# Patient Record
Sex: Male | Born: 1935
Health system: Southern US, Community
[De-identification: ages and names within clinical notes are randomized; demographics above are authoritative.]

## PROBLEM LIST (undated history)

## (undated) DIAGNOSIS — J449 Chronic obstructive pulmonary disease, unspecified: Secondary | ICD-10-CM

## (undated) DIAGNOSIS — I2510 Atherosclerotic heart disease of native coronary artery without angina pectoris: Secondary | ICD-10-CM

## (undated) DIAGNOSIS — I340 Nonrheumatic mitral (valve) insufficiency: Secondary | ICD-10-CM

## (undated) DIAGNOSIS — I10 Essential (primary) hypertension: Secondary | ICD-10-CM

## (undated) DIAGNOSIS — I255 Ischemic cardiomyopathy: Secondary | ICD-10-CM

## (undated) DIAGNOSIS — I4891 Unspecified atrial fibrillation: Secondary | ICD-10-CM

## (undated) DIAGNOSIS — I5020 Unspecified systolic (congestive) heart failure: Secondary | ICD-10-CM

## (undated) HISTORY — DX: Chronic obstructive pulmonary disease, unspecified: J44.9

## (undated) HISTORY — DX: Unspecified systolic (congestive) heart failure: I50.20

## (undated) HISTORY — DX: Atherosclerotic heart disease of native coronary artery without angina pectoris: I25.10

## (undated) HISTORY — DX: Essential (primary) hypertension: I10

## (undated) HISTORY — DX: Nonrheumatic mitral (valve) insufficiency: I34.0

## (undated) HISTORY — DX: Ischemic cardiomyopathy: I25.5

## (undated) HISTORY — PX: CORONARY ARTERY BYPASS GRAFT: SHX141

## (undated) HISTORY — DX: Unspecified atrial fibrillation: I48.91

---

## 2015-09-24 DIAGNOSIS — I2510 Atherosclerotic heart disease of native coronary artery without angina pectoris: Secondary | ICD-10-CM | POA: Diagnosis not present

## 2015-09-24 DIAGNOSIS — I10 Essential (primary) hypertension: Secondary | ICD-10-CM | POA: Diagnosis not present

## 2015-09-24 DIAGNOSIS — E782 Mixed hyperlipidemia: Secondary | ICD-10-CM | POA: Diagnosis not present

## 2015-09-24 DIAGNOSIS — I509 Heart failure, unspecified: Secondary | ICD-10-CM | POA: Diagnosis not present

## 2015-10-01 DIAGNOSIS — Z48812 Encounter for surgical aftercare following surgery on the circulatory system: Secondary | ICD-10-CM | POA: Diagnosis not present

## 2015-10-01 DIAGNOSIS — Z955 Presence of coronary angioplasty implant and graft: Secondary | ICD-10-CM | POA: Diagnosis not present

## 2015-10-01 DIAGNOSIS — I2510 Atherosclerotic heart disease of native coronary artery without angina pectoris: Secondary | ICD-10-CM | POA: Diagnosis not present

## 2015-10-10 DIAGNOSIS — Z955 Presence of coronary angioplasty implant and graft: Secondary | ICD-10-CM | POA: Diagnosis not present

## 2015-10-10 DIAGNOSIS — Z48812 Encounter for surgical aftercare following surgery on the circulatory system: Secondary | ICD-10-CM | POA: Diagnosis not present

## 2015-10-10 DIAGNOSIS — I2510 Atherosclerotic heart disease of native coronary artery without angina pectoris: Secondary | ICD-10-CM | POA: Diagnosis not present

## 2015-10-11 DIAGNOSIS — Z48812 Encounter for surgical aftercare following surgery on the circulatory system: Secondary | ICD-10-CM | POA: Diagnosis not present

## 2015-10-11 DIAGNOSIS — I2510 Atherosclerotic heart disease of native coronary artery without angina pectoris: Secondary | ICD-10-CM | POA: Diagnosis not present

## 2015-10-11 DIAGNOSIS — Z955 Presence of coronary angioplasty implant and graft: Secondary | ICD-10-CM | POA: Diagnosis not present

## 2015-10-14 DIAGNOSIS — I2510 Atherosclerotic heart disease of native coronary artery without angina pectoris: Secondary | ICD-10-CM | POA: Diagnosis not present

## 2015-10-14 DIAGNOSIS — Z955 Presence of coronary angioplasty implant and graft: Secondary | ICD-10-CM | POA: Diagnosis not present

## 2015-10-14 DIAGNOSIS — Z48812 Encounter for surgical aftercare following surgery on the circulatory system: Secondary | ICD-10-CM | POA: Diagnosis not present

## 2015-10-16 DIAGNOSIS — Z9861 Coronary angioplasty status: Secondary | ICD-10-CM | POA: Diagnosis not present

## 2015-10-16 DIAGNOSIS — I2510 Atherosclerotic heart disease of native coronary artery without angina pectoris: Secondary | ICD-10-CM | POA: Diagnosis not present

## 2015-10-18 DIAGNOSIS — I2510 Atherosclerotic heart disease of native coronary artery without angina pectoris: Secondary | ICD-10-CM | POA: Diagnosis not present

## 2015-10-18 DIAGNOSIS — Z9861 Coronary angioplasty status: Secondary | ICD-10-CM | POA: Diagnosis not present

## 2015-10-21 DIAGNOSIS — I2510 Atherosclerotic heart disease of native coronary artery without angina pectoris: Secondary | ICD-10-CM | POA: Diagnosis not present

## 2015-10-21 DIAGNOSIS — Z9861 Coronary angioplasty status: Secondary | ICD-10-CM | POA: Diagnosis not present

## 2015-10-23 DIAGNOSIS — Z9861 Coronary angioplasty status: Secondary | ICD-10-CM | POA: Diagnosis not present

## 2015-10-23 DIAGNOSIS — I2510 Atherosclerotic heart disease of native coronary artery without angina pectoris: Secondary | ICD-10-CM | POA: Diagnosis not present

## 2015-10-25 DIAGNOSIS — I2510 Atherosclerotic heart disease of native coronary artery without angina pectoris: Secondary | ICD-10-CM | POA: Diagnosis not present

## 2015-10-25 DIAGNOSIS — Z9861 Coronary angioplasty status: Secondary | ICD-10-CM | POA: Diagnosis not present

## 2015-10-28 DIAGNOSIS — Z9861 Coronary angioplasty status: Secondary | ICD-10-CM | POA: Diagnosis not present

## 2015-10-28 DIAGNOSIS — I2510 Atherosclerotic heart disease of native coronary artery without angina pectoris: Secondary | ICD-10-CM | POA: Diagnosis not present

## 2015-10-30 DIAGNOSIS — I2510 Atherosclerotic heart disease of native coronary artery without angina pectoris: Secondary | ICD-10-CM | POA: Diagnosis not present

## 2015-10-30 DIAGNOSIS — Z9861 Coronary angioplasty status: Secondary | ICD-10-CM | POA: Diagnosis not present

## 2015-11-01 DIAGNOSIS — Z9861 Coronary angioplasty status: Secondary | ICD-10-CM | POA: Diagnosis not present

## 2015-11-01 DIAGNOSIS — I2510 Atherosclerotic heart disease of native coronary artery without angina pectoris: Secondary | ICD-10-CM | POA: Diagnosis not present

## 2015-11-04 DIAGNOSIS — Z9861 Coronary angioplasty status: Secondary | ICD-10-CM | POA: Diagnosis not present

## 2015-11-04 DIAGNOSIS — I2510 Atherosclerotic heart disease of native coronary artery without angina pectoris: Secondary | ICD-10-CM | POA: Diagnosis not present

## 2015-11-06 DIAGNOSIS — I2510 Atherosclerotic heart disease of native coronary artery without angina pectoris: Secondary | ICD-10-CM | POA: Diagnosis not present

## 2015-11-06 DIAGNOSIS — Z9861 Coronary angioplasty status: Secondary | ICD-10-CM | POA: Diagnosis not present

## 2015-11-08 DIAGNOSIS — Z9861 Coronary angioplasty status: Secondary | ICD-10-CM | POA: Diagnosis not present

## 2015-11-08 DIAGNOSIS — I2510 Atherosclerotic heart disease of native coronary artery without angina pectoris: Secondary | ICD-10-CM | POA: Diagnosis not present

## 2015-11-13 DIAGNOSIS — Z48812 Encounter for surgical aftercare following surgery on the circulatory system: Secondary | ICD-10-CM | POA: Diagnosis not present

## 2015-11-13 DIAGNOSIS — Z955 Presence of coronary angioplasty implant and graft: Secondary | ICD-10-CM | POA: Diagnosis not present

## 2015-11-13 DIAGNOSIS — I2510 Atherosclerotic heart disease of native coronary artery without angina pectoris: Secondary | ICD-10-CM | POA: Diagnosis not present

## 2015-11-15 DIAGNOSIS — Z48812 Encounter for surgical aftercare following surgery on the circulatory system: Secondary | ICD-10-CM | POA: Diagnosis not present

## 2015-11-15 DIAGNOSIS — Z955 Presence of coronary angioplasty implant and graft: Secondary | ICD-10-CM | POA: Diagnosis not present

## 2015-11-15 DIAGNOSIS — I2510 Atherosclerotic heart disease of native coronary artery without angina pectoris: Secondary | ICD-10-CM | POA: Diagnosis not present

## 2015-11-18 DIAGNOSIS — Z48812 Encounter for surgical aftercare following surgery on the circulatory system: Secondary | ICD-10-CM | POA: Diagnosis not present

## 2015-11-18 DIAGNOSIS — I2510 Atherosclerotic heart disease of native coronary artery without angina pectoris: Secondary | ICD-10-CM | POA: Diagnosis not present

## 2015-11-18 DIAGNOSIS — Z955 Presence of coronary angioplasty implant and graft: Secondary | ICD-10-CM | POA: Diagnosis not present

## 2015-11-20 DIAGNOSIS — Z955 Presence of coronary angioplasty implant and graft: Secondary | ICD-10-CM | POA: Diagnosis not present

## 2015-11-20 DIAGNOSIS — I2510 Atherosclerotic heart disease of native coronary artery without angina pectoris: Secondary | ICD-10-CM | POA: Diagnosis not present

## 2015-11-20 DIAGNOSIS — Z48812 Encounter for surgical aftercare following surgery on the circulatory system: Secondary | ICD-10-CM | POA: Diagnosis not present

## 2015-11-22 DIAGNOSIS — Z955 Presence of coronary angioplasty implant and graft: Secondary | ICD-10-CM | POA: Diagnosis not present

## 2015-11-22 DIAGNOSIS — Z48812 Encounter for surgical aftercare following surgery on the circulatory system: Secondary | ICD-10-CM | POA: Diagnosis not present

## 2015-11-22 DIAGNOSIS — I2510 Atherosclerotic heart disease of native coronary artery without angina pectoris: Secondary | ICD-10-CM | POA: Diagnosis not present

## 2015-11-25 DIAGNOSIS — Z48812 Encounter for surgical aftercare following surgery on the circulatory system: Secondary | ICD-10-CM | POA: Diagnosis not present

## 2015-11-25 DIAGNOSIS — Z955 Presence of coronary angioplasty implant and graft: Secondary | ICD-10-CM | POA: Diagnosis not present

## 2015-11-25 DIAGNOSIS — I2510 Atherosclerotic heart disease of native coronary artery without angina pectoris: Secondary | ICD-10-CM | POA: Diagnosis not present

## 2015-11-26 DIAGNOSIS — I10 Essential (primary) hypertension: Secondary | ICD-10-CM | POA: Diagnosis not present

## 2015-11-26 DIAGNOSIS — J302 Other seasonal allergic rhinitis: Secondary | ICD-10-CM | POA: Diagnosis not present

## 2015-11-26 DIAGNOSIS — I2510 Atherosclerotic heart disease of native coronary artery without angina pectoris: Secondary | ICD-10-CM | POA: Diagnosis not present

## 2015-11-26 DIAGNOSIS — E782 Mixed hyperlipidemia: Secondary | ICD-10-CM | POA: Diagnosis not present

## 2015-11-27 DIAGNOSIS — Z48812 Encounter for surgical aftercare following surgery on the circulatory system: Secondary | ICD-10-CM | POA: Diagnosis not present

## 2015-11-27 DIAGNOSIS — Z955 Presence of coronary angioplasty implant and graft: Secondary | ICD-10-CM | POA: Diagnosis not present

## 2015-11-27 DIAGNOSIS — I2510 Atherosclerotic heart disease of native coronary artery without angina pectoris: Secondary | ICD-10-CM | POA: Diagnosis not present

## 2015-11-29 DIAGNOSIS — Z955 Presence of coronary angioplasty implant and graft: Secondary | ICD-10-CM | POA: Diagnosis not present

## 2015-11-29 DIAGNOSIS — Z48812 Encounter for surgical aftercare following surgery on the circulatory system: Secondary | ICD-10-CM | POA: Diagnosis not present

## 2015-11-29 DIAGNOSIS — I2510 Atherosclerotic heart disease of native coronary artery without angina pectoris: Secondary | ICD-10-CM | POA: Diagnosis not present

## 2015-12-02 DIAGNOSIS — Z48812 Encounter for surgical aftercare following surgery on the circulatory system: Secondary | ICD-10-CM | POA: Diagnosis not present

## 2015-12-02 DIAGNOSIS — Z955 Presence of coronary angioplasty implant and graft: Secondary | ICD-10-CM | POA: Diagnosis not present

## 2015-12-02 DIAGNOSIS — I2510 Atherosclerotic heart disease of native coronary artery without angina pectoris: Secondary | ICD-10-CM | POA: Diagnosis not present

## 2015-12-04 DIAGNOSIS — Z955 Presence of coronary angioplasty implant and graft: Secondary | ICD-10-CM | POA: Diagnosis not present

## 2015-12-04 DIAGNOSIS — Z48812 Encounter for surgical aftercare following surgery on the circulatory system: Secondary | ICD-10-CM | POA: Diagnosis not present

## 2015-12-04 DIAGNOSIS — I2510 Atherosclerotic heart disease of native coronary artery without angina pectoris: Secondary | ICD-10-CM | POA: Diagnosis not present

## 2015-12-06 DIAGNOSIS — Z955 Presence of coronary angioplasty implant and graft: Secondary | ICD-10-CM | POA: Diagnosis not present

## 2015-12-06 DIAGNOSIS — I2510 Atherosclerotic heart disease of native coronary artery without angina pectoris: Secondary | ICD-10-CM | POA: Diagnosis not present

## 2015-12-06 DIAGNOSIS — Z48812 Encounter for surgical aftercare following surgery on the circulatory system: Secondary | ICD-10-CM | POA: Diagnosis not present

## 2015-12-09 DIAGNOSIS — I2510 Atherosclerotic heart disease of native coronary artery without angina pectoris: Secondary | ICD-10-CM | POA: Diagnosis not present

## 2015-12-09 DIAGNOSIS — Z955 Presence of coronary angioplasty implant and graft: Secondary | ICD-10-CM | POA: Diagnosis not present

## 2015-12-09 DIAGNOSIS — Z48812 Encounter for surgical aftercare following surgery on the circulatory system: Secondary | ICD-10-CM | POA: Diagnosis not present

## 2015-12-11 DIAGNOSIS — Z955 Presence of coronary angioplasty implant and graft: Secondary | ICD-10-CM | POA: Diagnosis not present

## 2015-12-11 DIAGNOSIS — Z48812 Encounter for surgical aftercare following surgery on the circulatory system: Secondary | ICD-10-CM | POA: Diagnosis not present

## 2015-12-11 DIAGNOSIS — I2510 Atherosclerotic heart disease of native coronary artery without angina pectoris: Secondary | ICD-10-CM | POA: Diagnosis not present

## 2015-12-16 DIAGNOSIS — Z79899 Other long term (current) drug therapy: Secondary | ICD-10-CM | POA: Diagnosis not present

## 2015-12-16 DIAGNOSIS — Z7982 Long term (current) use of aspirin: Secondary | ICD-10-CM | POA: Diagnosis not present

## 2015-12-16 DIAGNOSIS — I2510 Atherosclerotic heart disease of native coronary artery without angina pectoris: Secondary | ICD-10-CM | POA: Diagnosis not present

## 2015-12-16 DIAGNOSIS — Z955 Presence of coronary angioplasty implant and graft: Secondary | ICD-10-CM | POA: Diagnosis not present

## 2015-12-16 DIAGNOSIS — Z7902 Long term (current) use of antithrombotics/antiplatelets: Secondary | ICD-10-CM | POA: Diagnosis not present

## 2015-12-18 DIAGNOSIS — I2510 Atherosclerotic heart disease of native coronary artery without angina pectoris: Secondary | ICD-10-CM | POA: Diagnosis not present

## 2015-12-18 DIAGNOSIS — Z7982 Long term (current) use of aspirin: Secondary | ICD-10-CM | POA: Diagnosis not present

## 2015-12-18 DIAGNOSIS — Z955 Presence of coronary angioplasty implant and graft: Secondary | ICD-10-CM | POA: Diagnosis not present

## 2015-12-18 DIAGNOSIS — Z7902 Long term (current) use of antithrombotics/antiplatelets: Secondary | ICD-10-CM | POA: Diagnosis not present

## 2015-12-18 DIAGNOSIS — Z79899 Other long term (current) drug therapy: Secondary | ICD-10-CM | POA: Diagnosis not present

## 2015-12-20 DIAGNOSIS — I2510 Atherosclerotic heart disease of native coronary artery without angina pectoris: Secondary | ICD-10-CM | POA: Diagnosis not present

## 2015-12-20 DIAGNOSIS — Z79899 Other long term (current) drug therapy: Secondary | ICD-10-CM | POA: Diagnosis not present

## 2015-12-20 DIAGNOSIS — Z7982 Long term (current) use of aspirin: Secondary | ICD-10-CM | POA: Diagnosis not present

## 2015-12-20 DIAGNOSIS — Z955 Presence of coronary angioplasty implant and graft: Secondary | ICD-10-CM | POA: Diagnosis not present

## 2015-12-20 DIAGNOSIS — Z7902 Long term (current) use of antithrombotics/antiplatelets: Secondary | ICD-10-CM | POA: Diagnosis not present

## 2015-12-27 DIAGNOSIS — Z79899 Other long term (current) drug therapy: Secondary | ICD-10-CM | POA: Diagnosis not present

## 2015-12-27 DIAGNOSIS — I2510 Atherosclerotic heart disease of native coronary artery without angina pectoris: Secondary | ICD-10-CM | POA: Diagnosis not present

## 2015-12-27 DIAGNOSIS — Z955 Presence of coronary angioplasty implant and graft: Secondary | ICD-10-CM | POA: Diagnosis not present

## 2015-12-27 DIAGNOSIS — Z7982 Long term (current) use of aspirin: Secondary | ICD-10-CM | POA: Diagnosis not present

## 2015-12-27 DIAGNOSIS — Z7902 Long term (current) use of antithrombotics/antiplatelets: Secondary | ICD-10-CM | POA: Diagnosis not present

## 2015-12-30 DIAGNOSIS — I2510 Atherosclerotic heart disease of native coronary artery without angina pectoris: Secondary | ICD-10-CM | POA: Diagnosis not present

## 2015-12-30 DIAGNOSIS — Z7902 Long term (current) use of antithrombotics/antiplatelets: Secondary | ICD-10-CM | POA: Diagnosis not present

## 2015-12-30 DIAGNOSIS — Z7982 Long term (current) use of aspirin: Secondary | ICD-10-CM | POA: Diagnosis not present

## 2015-12-30 DIAGNOSIS — Z79899 Other long term (current) drug therapy: Secondary | ICD-10-CM | POA: Diagnosis not present

## 2015-12-30 DIAGNOSIS — Z955 Presence of coronary angioplasty implant and graft: Secondary | ICD-10-CM | POA: Diagnosis not present

## 2016-01-01 DIAGNOSIS — Z955 Presence of coronary angioplasty implant and graft: Secondary | ICD-10-CM | POA: Diagnosis not present

## 2016-01-01 DIAGNOSIS — Z7982 Long term (current) use of aspirin: Secondary | ICD-10-CM | POA: Diagnosis not present

## 2016-01-01 DIAGNOSIS — I2510 Atherosclerotic heart disease of native coronary artery without angina pectoris: Secondary | ICD-10-CM | POA: Diagnosis not present

## 2016-01-01 DIAGNOSIS — Z79899 Other long term (current) drug therapy: Secondary | ICD-10-CM | POA: Diagnosis not present

## 2016-01-01 DIAGNOSIS — Z7902 Long term (current) use of antithrombotics/antiplatelets: Secondary | ICD-10-CM | POA: Diagnosis not present

## 2016-01-03 DIAGNOSIS — Z7902 Long term (current) use of antithrombotics/antiplatelets: Secondary | ICD-10-CM | POA: Diagnosis not present

## 2016-01-03 DIAGNOSIS — Z7982 Long term (current) use of aspirin: Secondary | ICD-10-CM | POA: Diagnosis not present

## 2016-01-03 DIAGNOSIS — Z955 Presence of coronary angioplasty implant and graft: Secondary | ICD-10-CM | POA: Diagnosis not present

## 2016-01-03 DIAGNOSIS — I2510 Atherosclerotic heart disease of native coronary artery without angina pectoris: Secondary | ICD-10-CM | POA: Diagnosis not present

## 2016-01-03 DIAGNOSIS — Z79899 Other long term (current) drug therapy: Secondary | ICD-10-CM | POA: Diagnosis not present

## 2016-01-06 DIAGNOSIS — Z955 Presence of coronary angioplasty implant and graft: Secondary | ICD-10-CM | POA: Diagnosis not present

## 2016-01-06 DIAGNOSIS — Z7982 Long term (current) use of aspirin: Secondary | ICD-10-CM | POA: Diagnosis not present

## 2016-01-06 DIAGNOSIS — Z79899 Other long term (current) drug therapy: Secondary | ICD-10-CM | POA: Diagnosis not present

## 2016-01-06 DIAGNOSIS — Z7902 Long term (current) use of antithrombotics/antiplatelets: Secondary | ICD-10-CM | POA: Diagnosis not present

## 2016-01-06 DIAGNOSIS — I10 Essential (primary) hypertension: Secondary | ICD-10-CM | POA: Diagnosis not present

## 2016-01-06 DIAGNOSIS — I340 Nonrheumatic mitral (valve) insufficiency: Secondary | ICD-10-CM | POA: Diagnosis not present

## 2016-01-06 DIAGNOSIS — E785 Hyperlipidemia, unspecified: Secondary | ICD-10-CM | POA: Diagnosis not present

## 2016-01-06 DIAGNOSIS — I4891 Unspecified atrial fibrillation: Secondary | ICD-10-CM | POA: Diagnosis not present

## 2016-01-06 DIAGNOSIS — I2510 Atherosclerotic heart disease of native coronary artery without angina pectoris: Secondary | ICD-10-CM | POA: Diagnosis not present

## 2016-02-14 DIAGNOSIS — I10 Essential (primary) hypertension: Secondary | ICD-10-CM | POA: Diagnosis not present

## 2016-02-14 DIAGNOSIS — E559 Vitamin D deficiency, unspecified: Secondary | ICD-10-CM | POA: Diagnosis not present

## 2016-02-14 DIAGNOSIS — E782 Mixed hyperlipidemia: Secondary | ICD-10-CM | POA: Diagnosis not present

## 2016-06-04 DIAGNOSIS — H1700 Adherent leukoma, unspecified eye: Secondary | ICD-10-CM | POA: Diagnosis not present

## 2016-06-04 DIAGNOSIS — H21261 Iris atrophy (essential) (progressive), right eye: Secondary | ICD-10-CM | POA: Diagnosis not present

## 2016-07-08 DIAGNOSIS — I10 Essential (primary) hypertension: Secondary | ICD-10-CM | POA: Diagnosis not present

## 2016-07-08 DIAGNOSIS — Z87891 Personal history of nicotine dependence: Secondary | ICD-10-CM | POA: Diagnosis not present

## 2016-07-08 DIAGNOSIS — I4891 Unspecified atrial fibrillation: Secondary | ICD-10-CM | POA: Diagnosis not present

## 2016-07-08 DIAGNOSIS — E785 Hyperlipidemia, unspecified: Secondary | ICD-10-CM | POA: Diagnosis not present

## 2016-07-08 DIAGNOSIS — I2510 Atherosclerotic heart disease of native coronary artery without angina pectoris: Secondary | ICD-10-CM | POA: Diagnosis not present

## 2016-07-08 DIAGNOSIS — I340 Nonrheumatic mitral (valve) insufficiency: Secondary | ICD-10-CM | POA: Diagnosis not present

## 2016-07-09 DIAGNOSIS — I2510 Atherosclerotic heart disease of native coronary artery without angina pectoris: Secondary | ICD-10-CM | POA: Diagnosis not present

## 2016-07-09 DIAGNOSIS — E785 Hyperlipidemia, unspecified: Secondary | ICD-10-CM | POA: Diagnosis not present

## 2016-09-17 ENCOUNTER — Telehealth: Payer: Self-pay | Admitting: Cardiovascular Disease

## 2016-09-17 NOTE — Telephone Encounter (Signed)
I spoke with pt and scheduled him to see Dr. Angelena Form on January 19,2018 at 4:00.

## 2016-09-17 NOTE — Telephone Encounter (Signed)
Ne message  Pt call to F/u on new pt appt for Dr. Angelena Form. please call back to discuss

## 2016-10-02 ENCOUNTER — Encounter (INDEPENDENT_AMBULATORY_CARE_PROVIDER_SITE_OTHER): Payer: Self-pay

## 2016-10-02 ENCOUNTER — Ambulatory Visit (INDEPENDENT_AMBULATORY_CARE_PROVIDER_SITE_OTHER): Payer: Medicare Other | Admitting: Cardiovascular Disease

## 2016-10-02 ENCOUNTER — Encounter: Payer: Self-pay | Admitting: Cardiovascular Disease

## 2016-10-02 VITALS — BP 150/64 | HR 71 | Ht 69.0 in | Wt 156.2 lb

## 2016-10-02 DIAGNOSIS — I2510 Atherosclerotic heart disease of native coronary artery without angina pectoris: Secondary | ICD-10-CM | POA: Diagnosis not present

## 2016-10-02 DIAGNOSIS — I255 Ischemic cardiomyopathy: Secondary | ICD-10-CM

## 2016-10-02 DIAGNOSIS — I480 Paroxysmal atrial fibrillation: Secondary | ICD-10-CM

## 2016-10-02 DIAGNOSIS — F17201 Nicotine dependence, unspecified, in remission: Secondary | ICD-10-CM

## 2016-10-02 DIAGNOSIS — I739 Peripheral vascular disease, unspecified: Secondary | ICD-10-CM | POA: Diagnosis not present

## 2016-10-02 DIAGNOSIS — I340 Nonrheumatic mitral (valve) insufficiency: Secondary | ICD-10-CM

## 2016-10-02 NOTE — Progress Notes (Signed)
Chief Complaint  Patient presents with  . New Patient (Initial Visit)    History of Present Illness: 83 yo male with history of CAD s/p 4V CABG in 2013, atrial flutter/fib s/p ablation 2013, ischemic cardiomyopathy, chronic systolic CHF, COPD who is here today as a new patient to establish cardiology care. He has been followed in [place] by Dr. Rozetta Nunnery but recently moved to [facility]. Cardiac history with 4V CABG in November 2013 (LIMA to LAD, SVG to OM, SVG to PDA, SVG to Diagonal) and stenting of the left main artery in 2016 (DES placed from left main into the diagonal not protected by LIMA graft. Most recent echo in 2016 with normal LVEF and mild MR. Ablation of atrial flutter in 2013. He is also known to have PAD. He was told he had blockages in his legs and was seen by vascular surgery but he says they said there was nothing to worry about. Former smoker (50 pack years, stopped smoking in 1998).   He is feeling well. No chest pain, dyspnea, palpitations. He is very active in the yard. No swelling in his legs. No pain in legs with walking.   Primary Care Physician: Vidal Schwalbe, MD   Past Medical History:  Diagnosis Date  . Atrial fibrillation ([place])   . CAD (coronary artery disease)    s/p CABG  . COPD (chronic obstructive pulmonary disease) ([place])   . HTN (hypertension)   . Ischemic cardiomyopathy   . Mitral regurgitation   . Systolic CHF [facility])     Past Surgical History:  Procedure Laterality Date  . CORONARY ARTERY BYPASS GRAFT     4Vessel    Current Outpatient Prescriptions  Medication Sig Dispense Refill  . aspirin EC 81 MG tablet Take 81 mg by mouth daily.    . carvedilol (COREG) 6.25 MG tablet Take 6.25 mg by mouth 2 (two) times daily.    . clopidogrel (PLAVIX) 75 MG tablet Take 75 mg by mouth daily.    Marland Kitchen lisinopril (PRINIVIL,ZESTRIL) 2.5 MG tablet Take 2.5 mg by mouth daily.    . simvastatin (ZOCOR) 20 MG tablet Take 20 mg by mouth daily.      No current facility-administered medications for this visit.     No known drug allergies  Social History   Social History  . Marital status: Married    Spouse name: N/A  . Number of children: 3  . Years of education: N/A   Occupational History  . Model maker for [employer]    Social History Main Topics  . Smoking status: Former Smoker    Packs/day: 1.00    Years: 50.00    Types: Cigarettes    Quit date: 10/02/1996  . Smokeless tobacco: Never Used  . Alcohol use 4.2 oz/week    7 Glasses of wine per week  . Drug use: No  . Sexual activity: Not on file   Other Topics Concern  . Not on file   Social History Narrative  . No narrative on file    Family History  Problem Relation Age of Onset  . Cancer Mother   . Heart attack Father 66    Review of Systems:  As stated in the HPI and otherwise negative.   BP (!) 150/64   Pulse 71   Ht 5\' 9"  (1.753 m)   Wt 156 lb 3.2 oz (70.9 kg)   BMI 23.07 kg/m   Physical Examination: General: Well developed, well nourished, NAD  HEENT: OP clear, mucus membranes moist  SKIN: warm, dry. No rashes. Neuro: No focal deficits  Musculoskeletal: Muscle strength 5/5 all ext  Psychiatric: Mood and affect normal  Neck: No JVD, no carotid bruits, no thyromegaly, no lymphadenopathy.  Lungs:Clear bilaterally, no wheezes, rhonci, crackles Cardiovascular: Regular rate and rhythm. No murmurs, gallops or rubs. Abdomen:Soft. Bowel sounds present. Non-tender.  Extremities: No lower extremity edema. Pulses are non-palpable in the bilateral DP/PT.  EKG:  EKG is ordered today. The ekg ordered today demonstrates NSR, rate 71 bpm. LVH. Old inferior infarct.   Recent Labs: No results found for requested labs within last 8760 hours.   Lipid Panel No results found for: CHOL, TRIG, HDL, CHOLHDL, VLDL, LDLCALC, LDLDIRECT   Wt Readings from Last 3 Encounters:  10/02/16 156 lb 3.2 oz (70.9 kg)     Other studies Reviewed: Additional  studies/ records that were reviewed today include: . Review of the above records demonstrates:   Assessment and Plan:   1. CAD s/p CABG without angina: He had CABG in 2013 and stenting of the left main into the Diagonal branch (unprotected by graft) in 2016. He is doing well. No chest pain suggestive of angina. He is on ASA, Plavix, statin, beta blocker.   2. Ischemic cardiomyopathy: Last LVEF normal by echo December 2016. Continue beta blocker and Ace-inh.   3. Chronic systolic CHF: Volume status is ok.   4. PAD: He has known PAD by non-invasive studies at [facility] in 2014 with ABI 0.76 on the left and 0.46 on the right. We have discussed repeating non-invasive studies now but he wishes to wait. He will call if he begins having pain in his legs.   5. Atrial fibrillation/flutter: s/p ablation. He is in sinus today. He has not been on anti-coagulation. He is on ASA and Plavix. If he has recurrence of atrial fibrillation, will need to start anti-coagulation.   6. Former tobacco abuse: He stopped smoking in 1998.   7. Mitral regurgitation: mild by echo December 2016.   Current medicines are reviewed at length with the patient today.  The patient does not have concerns regarding medicines.  The following changes have been made:  no change  Labs/ tests ordered today include:  No orders of the defined types were placed in this encounter.    Disposition:   FU with me in 6 months   Signed, Lauree Chandler, MD 10/02/2016 4:41 PM    [facility] [address], [place], [place]  [address] Phone: [phone]; Fax: [phone]

## 2016-10-02 NOTE — Patient Instructions (Signed)
Medication Instructions:  Your physician recommends that you continue on your current medications as directed. Please refer to the Current Medication list given to you today.   Labwork: none  Testing/Procedures: none  Follow-Up: Your physician recommends that you schedule a follow-up appointment in: 6 months. Please call our office in 3 months to schedule this appointment   Any Other Special Instructions Will Be Listed Below (If Applicable).     If you need a refill on your cardiac medications before your next appointment, please call your pharmacy.   

## 2016-10-07 ENCOUNTER — Encounter: Payer: Self-pay | Admitting: Cardiovascular Disease

## 2016-10-21 DIAGNOSIS — S61002A Unspecified open wound of left thumb without damage to nail, initial encounter: Secondary | ICD-10-CM | POA: Diagnosis not present

## 2016-10-26 DIAGNOSIS — I10 Essential (primary) hypertension: Secondary | ICD-10-CM | POA: Diagnosis not present

## 2016-10-26 DIAGNOSIS — I2510 Atherosclerotic heart disease of native coronary artery without angina pectoris: Secondary | ICD-10-CM | POA: Diagnosis not present

## 2016-10-26 DIAGNOSIS — E785 Hyperlipidemia, unspecified: Secondary | ICD-10-CM | POA: Diagnosis not present

## 2016-10-26 DIAGNOSIS — I739 Peripheral vascular disease, unspecified: Secondary | ICD-10-CM | POA: Diagnosis not present

## 2016-11-02 DIAGNOSIS — Z4802 Encounter for removal of sutures: Secondary | ICD-10-CM | POA: Diagnosis not present

## 2016-12-19 ENCOUNTER — Emergency Department (HOSPITAL_COMMUNITY): Payer: Medicare Other

## 2016-12-19 ENCOUNTER — Inpatient Hospital Stay (HOSPITAL_COMMUNITY)
Admission: EM | Admit: 2016-12-19 | Discharge: 2016-12-21 | DRG: 291 | Disposition: A | Discharge status: Home / Self Care | Payer: Medicare Other | Attending: Internal Medicine | Admitting: Internal Medicine

## 2016-12-19 ENCOUNTER — Other Ambulatory Visit: Payer: Self-pay

## 2016-12-19 ENCOUNTER — Encounter (HOSPITAL_COMMUNITY): Payer: Self-pay

## 2016-12-19 DIAGNOSIS — I161 Hypertensive emergency: Secondary | ICD-10-CM | POA: Diagnosis present

## 2016-12-19 DIAGNOSIS — I4891 Unspecified atrial fibrillation: Secondary | ICD-10-CM | POA: Insufficient documentation

## 2016-12-19 DIAGNOSIS — R0600 Dyspnea, unspecified: Secondary | ICD-10-CM | POA: Diagnosis not present

## 2016-12-19 DIAGNOSIS — I5023 Acute on chronic systolic (congestive) heart failure: Secondary | ICD-10-CM | POA: Diagnosis present

## 2016-12-19 DIAGNOSIS — I2510 Atherosclerotic heart disease of native coronary artery without angina pectoris: Secondary | ICD-10-CM | POA: Insufficient documentation

## 2016-12-19 DIAGNOSIS — J9601 Acute respiratory failure with hypoxia: Secondary | ICD-10-CM | POA: Diagnosis present

## 2016-12-19 DIAGNOSIS — I255 Ischemic cardiomyopathy: Secondary | ICD-10-CM | POA: Diagnosis not present

## 2016-12-19 DIAGNOSIS — I739 Peripheral vascular disease, unspecified: Secondary | ICD-10-CM | POA: Diagnosis present

## 2016-12-19 DIAGNOSIS — I509 Heart failure, unspecified: Secondary | ICD-10-CM | POA: Diagnosis not present

## 2016-12-19 DIAGNOSIS — R911 Solitary pulmonary nodule: Secondary | ICD-10-CM | POA: Diagnosis not present

## 2016-12-19 DIAGNOSIS — Z79899 Other long term (current) drug therapy: Secondary | ICD-10-CM

## 2016-12-19 DIAGNOSIS — Z8249 Family history of ischemic heart disease and other diseases of the circulatory system: Secondary | ICD-10-CM

## 2016-12-19 DIAGNOSIS — I340 Nonrheumatic mitral (valve) insufficiency: Secondary | ICD-10-CM

## 2016-12-19 DIAGNOSIS — Z955 Presence of coronary angioplasty implant and graft: Secondary | ICD-10-CM | POA: Diagnosis not present

## 2016-12-19 DIAGNOSIS — Z87891 Personal history of nicotine dependence: Secondary | ICD-10-CM

## 2016-12-19 DIAGNOSIS — J449 Chronic obstructive pulmonary disease, unspecified: Secondary | ICD-10-CM | POA: Diagnosis not present

## 2016-12-19 DIAGNOSIS — Z7982 Long term (current) use of aspirin: Secondary | ICD-10-CM | POA: Diagnosis not present

## 2016-12-19 DIAGNOSIS — I5043 Acute on chronic combined systolic (congestive) and diastolic (congestive) heart failure: Secondary | ICD-10-CM | POA: Diagnosis present

## 2016-12-19 DIAGNOSIS — J810 Acute pulmonary edema: Secondary | ICD-10-CM | POA: Diagnosis not present

## 2016-12-19 DIAGNOSIS — I5022 Chronic systolic (congestive) heart failure: Secondary | ICD-10-CM | POA: Diagnosis not present

## 2016-12-19 DIAGNOSIS — Z951 Presence of aortocoronary bypass graft: Secondary | ICD-10-CM

## 2016-12-19 DIAGNOSIS — I110 Hypertensive heart disease with heart failure: Secondary | ICD-10-CM | POA: Diagnosis not present

## 2016-12-19 DIAGNOSIS — Z7902 Long term (current) use of antithrombotics/antiplatelets: Secondary | ICD-10-CM | POA: Diagnosis not present

## 2016-12-19 DIAGNOSIS — R0902 Hypoxemia: Secondary | ICD-10-CM | POA: Diagnosis not present

## 2016-12-19 DIAGNOSIS — I5020 Unspecified systolic (congestive) heart failure: Secondary | ICD-10-CM | POA: Insufficient documentation

## 2016-12-19 DIAGNOSIS — R0603 Acute respiratory distress: Secondary | ICD-10-CM | POA: Diagnosis not present

## 2016-12-19 DIAGNOSIS — I10 Essential (primary) hypertension: Secondary | ICD-10-CM | POA: Diagnosis not present

## 2016-12-19 DIAGNOSIS — R0602 Shortness of breath: Secondary | ICD-10-CM | POA: Diagnosis not present

## 2016-12-19 LAB — URINALYSIS, ROUTINE W REFLEX MICROSCOPIC
BILIRUBIN URINE: NEGATIVE
Glucose, UA: NEGATIVE mg/dL
Hgb urine dipstick: NEGATIVE
KETONES UR: NEGATIVE mg/dL
LEUKOCYTES UA: NEGATIVE
NITRITE: NEGATIVE
PH: 5 (ref 5.0–8.0)
PROTEIN: NEGATIVE mg/dL
Specific Gravity, Urine: 1.005 (ref 1.005–1.030)

## 2016-12-19 LAB — COMPREHENSIVE METABOLIC PANEL
ALK PHOS: 56 U/L (ref 38–126)
ALT: 18 U/L (ref 17–63)
AST: 26 U/L (ref 15–41)
Albumin: 4.2 g/dL (ref 3.5–5.0)
Anion gap: 11 (ref 5–15)
BUN: 15 mg/dL (ref 6–20)
CALCIUM: 8.9 mg/dL (ref 8.9–10.3)
CO2: 23 mmol/L (ref 22–32)
CREATININE: 1.26 mg/dL — AB (ref 0.61–1.24)
Chloride: 107 mmol/L (ref 101–111)
GFR, EST AFRICAN AMERICAN: 59 mL/min — AB (ref >60)
GFR, EST NON AFRICAN AMERICAN: 51 mL/min — AB (ref >60)
Glucose, Bld: 147 mg/dL — ABNORMAL HIGH (ref 65–99)
Potassium: 4.7 mmol/L (ref 3.5–5.1)
Sodium: 141 mmol/L (ref 135–145)
TOTAL PROTEIN: 7.1 g/dL (ref 6.5–8.1)
Total Bilirubin: 0.8 mg/dL (ref 0.3–1.2)

## 2016-12-19 LAB — CBC
HCT: 48.4 % (ref 39.0–52.0)
HEMOGLOBIN: 16 g/dL (ref 13.0–17.0)
MCH: 31.1 pg (ref 26.0–34.0)
MCHC: 33.1 g/dL (ref 30.0–36.0)
MCV: 94.2 fL (ref 78.0–100.0)
PLATELETS: 265 10*3/uL (ref 150–400)
RBC: 5.14 MIL/uL (ref 4.22–5.81)
RDW: 13.7 % (ref 11.5–15.5)
WBC: 13.6 10*3/uL — AB (ref 4.0–10.5)

## 2016-12-19 LAB — D-DIMER, QUANTITATIVE (NOT AT ARMC): D DIMER QUANT: 1.24 ug{FEU}/mL — AB (ref 0.00–0.50)

## 2016-12-19 LAB — BRAIN NATRIURETIC PEPTIDE: B NATRIURETIC PEPTIDE 5: 756.2 pg/mL — AB (ref 0.0–100.0)

## 2016-12-19 LAB — I-STAT CHEM 8, ED
BUN: 21 mg/dL — ABNORMAL HIGH (ref 6–20)
CALCIUM ION: 1.19 mmol/L (ref 1.15–1.40)
CHLORIDE: 109 mmol/L (ref 101–111)
CREATININE: 1.1 mg/dL (ref 0.61–1.24)
GLUCOSE: 142 mg/dL — AB (ref 65–99)
HCT: 48 % (ref 39.0–52.0)
Hemoglobin: 16.3 g/dL (ref 13.0–17.0)
POTASSIUM: 4.7 mmol/L (ref 3.5–5.1)
Sodium: 143 mmol/L (ref 135–145)
TCO2: 26 mmol/L (ref 0–100)

## 2016-12-19 LAB — MRSA PCR SCREENING: MRSA BY PCR: NEGATIVE

## 2016-12-19 LAB — I-STAT TROPONIN, ED: Troponin i, poc: 0.01 ng/mL (ref 0.00–0.08)

## 2016-12-19 MED ORDER — SODIUM CHLORIDE 0.9 % IV SOLN
250.0000 mL | INTRAVENOUS | Status: DC | PRN
  Administered 2016-12-20: 10 mL via INTRAVENOUS

## 2016-12-19 MED ORDER — FUROSEMIDE 10 MG/ML IJ SOLN
60.0000 mg | Freq: Once | INTRAMUSCULAR | Status: AC
  Administered 2016-12-19: 60 mg via INTRAVENOUS
  Filled 2016-12-19: qty 6

## 2016-12-19 MED ORDER — SODIUM CHLORIDE 0.9% FLUSH
3.0000 mL | Freq: Two times a day (BID) | INTRAVENOUS | Status: DC
  Administered 2016-12-19 – 2016-12-20 (×3): 3 mL via INTRAVENOUS

## 2016-12-19 MED ORDER — POTASSIUM CHLORIDE CRYS ER 20 MEQ PO TBCR
40.0000 meq | EXTENDED_RELEASE_TABLET | Freq: Two times a day (BID) | ORAL | Status: DC
  Administered 2016-12-19 – 2016-12-21 (×4): 40 meq via ORAL
  Filled 2016-12-19 (×4): qty 2

## 2016-12-19 MED ORDER — ONDANSETRON HCL 4 MG/2ML IJ SOLN: 4.0000 mg | Freq: Four times a day (QID) | INTRAMUSCULAR | Status: DC | PRN

## 2016-12-19 MED ORDER — IOPAMIDOL (ISOVUE-370) INJECTION 76%
INTRAVENOUS | Status: AC
  Administered 2016-12-19: 100 mL
  Filled 2016-12-19: qty 100

## 2016-12-19 MED ORDER — CLOPIDOGREL BISULFATE 75 MG PO TABS
75.0000 mg | ORAL_TABLET | Freq: Every day | ORAL | Status: DC
  Administered 2016-12-19 – 2016-12-21 (×3): 75 mg via ORAL
  Filled 2016-12-19 (×3): qty 1

## 2016-12-19 MED ORDER — NITROGLYCERIN IN D5W 200-5 MCG/ML-% IV SOLN
0.0000 ug/min | Freq: Once | INTRAVENOUS | Status: AC
  Administered 2016-12-19: 5 ug/min via INTRAVENOUS
  Filled 2016-12-19: qty 250

## 2016-12-19 MED ORDER — NITROGLYCERIN IN D5W 200-5 MCG/ML-% IV SOLN
0.0000 ug/min | INTRAVENOUS | Status: DC
  Administered 2016-12-19: 10 ug/min via INTRAVENOUS

## 2016-12-19 MED ORDER — SIMVASTATIN 20 MG PO TABS
20.0000 mg | ORAL_TABLET | Freq: Every day | ORAL | Status: DC
  Administered 2016-12-19 – 2016-12-21 (×3): 20 mg via ORAL
  Filled 2016-12-19 (×3): qty 1

## 2016-12-19 MED ORDER — ASPIRIN EC 81 MG PO TBEC
81.0000 mg | DELAYED_RELEASE_TABLET | Freq: Every day | ORAL | Status: DC
  Administered 2016-12-19 – 2016-12-21 (×3): 81 mg via ORAL
  Filled 2016-12-19 (×3): qty 1

## 2016-12-19 MED ORDER — ACETAMINOPHEN 325 MG PO TABS: 650.0000 mg | ORAL_TABLET | ORAL | Status: DC | PRN

## 2016-12-19 MED ORDER — ENOXAPARIN SODIUM 40 MG/0.4ML ~~LOC~~ SOLN
40.0000 mg | SUBCUTANEOUS | Status: DC
  Administered 2016-12-19 – 2016-12-20 (×2): 40 mg via SUBCUTANEOUS
  Filled 2016-12-19 (×2): qty 0.4

## 2016-12-19 MED ORDER — FUROSEMIDE 10 MG/ML IJ SOLN
60.0000 mg | Freq: Two times a day (BID) | INTRAMUSCULAR | Status: DC
  Administered 2016-12-19 – 2016-12-20 (×2): 60 mg via INTRAVENOUS
  Filled 2016-12-19 (×2): qty 6

## 2016-12-19 MED ORDER — LISINOPRIL 2.5 MG PO TABS
2.5000 mg | ORAL_TABLET | Freq: Every day | ORAL | Status: DC
  Administered 2016-12-19 – 2016-12-20 (×2): 2.5 mg via ORAL
  Filled 2016-12-19 (×2): qty 1

## 2016-12-19 MED ORDER — SODIUM CHLORIDE 0.9% FLUSH: 3.0000 mL | INTRAVENOUS | Status: DC | PRN

## 2016-12-19 NOTE — H&P (Signed)
History and Physical  Dean Bean OXB:[id number] DOB: 1931-05-14 DOA: 12/19/2016  Referring physician: Dr Jeanell Sparrow, ED physician PCP: Vidal Schwalbe, MD  Outpatient Specialists:  Dr Angelena Form ([facility])  Patient Coming From: Home  Chief Complaint: Shortness of breath  HPI: Dean Bean is a 83 y.o. male with a history of atrial fibrillation status post ablation, coronary artery disease status post four-vessel CABG approximately 4-5 years ago with stenting 2 years ago, hypertension, ischemic cardiomyopathy, chronic systolic heart failure, and COPD. Patient presents with sudden onset of shortness of breath with dyspnea on exertion. Patient awoke this morning feeling very uneasy in bed and having difficult time breathing. The patient related to the bathroom, but continued to have difficulty. EMS was called and the patient was brought to the hospital on C Pap with oxygen saturations in the 80s. The patient was placed on BiPAP here and immediately had appropriate response with his oxygenation. Patient was given Lasix, the patient's breathing improved. The patient came off the BiPAP has been supported on nasal cannula and is currently breathing comfortably.  In conversation to with the patient, the patient had a heavy salt meal last night: [place] food with soy sauce. He is not currently on diuretics.   Review of Systems:   Pt denies any fevers, chills, nausea, vomiting, diarrhea, constipation, abdominal pain, shortness of breath, dyspnea on exertion, orthopnea, cough, wheezing, palpitations, headache, vision changes, lightheadedness, dizziness, melena, rectal bleeding.  Review of systems are otherwise negative  Past Medical History:  Diagnosis Date  . Atrial fibrillation ([place])   . CAD (coronary artery disease)    s/p CABG  . COPD (chronic obstructive pulmonary disease) ([place])   . HTN (hypertension)   . Ischemic cardiomyopathy   . Mitral regurgitation   . Systolic CHF [facility])    Past  Surgical History:  Procedure Laterality Date  . CORONARY ARTERY BYPASS GRAFT     4Vessel   Social History:  reports that he quit smoking about 20 years ago. His smoking use included Cigarettes. He has a 50.00 pack-year smoking history. He has never used smokeless tobacco. He reports that he drinks about 4.2 oz of alcohol per week . He reports that he does not use drugs. Patient lives at Home  Family History  Problem Relation Age of Onset  . Cancer Mother   . Heart attack Father 20     Prior to Admission medications   Medication Sig Start Date End Date Taking? Authorizing Provider  aspirin EC 81 MG tablet Take 81 mg by mouth daily.    Historical Provider, MD  carvedilol (COREG) 6.25 MG tablet Take 6.25 mg by mouth 2 (two) times daily. 07/20/16   Historical Provider, MD  clopidogrel (PLAVIX) 75 MG tablet Take 75 mg by mouth daily. 07/15/16   Historical Provider, MD  lisinopril (PRINIVIL,ZESTRIL) 2.5 MG tablet Take 2.5 mg by mouth daily. 07/20/16   Historical Provider, MD  simvastatin (ZOCOR) 20 MG tablet Take 20 mg by mouth daily. 07/20/16   Historical Provider, MD    Physical Exam: BP (!) 154/60   Pulse 76   Temp 97.2 F (36.2 C) (Axillary)   Resp 18   Ht 5\' 7"  (1.702 m)   Wt 70.8 kg (156 lb)   SpO2 94%   BMI 24.43 kg/m   General: Elderly Caucasian male who appears his stated age. Awake and alert and oriented x3. No acute cardiopulmonary distress.  HEENT: Normocephalic atraumatic.  Right and left ears normal in appearance.  Pupils equal, round, reactive  to light. Extraocular muscles are intact. Sclerae anicteric and noninjected.  Moist mucosal membranes. No mucosal lesions.  Neck: Neck supple without lymphadenopathy. No carotid bruits. No masses palpated.  Cardiovascular: Regular rate with normal S1-S2 sounds. No murmurs, rubs, gallops auscultated. JVD to 9 cm.  Respiratory: Mild rales in bases bilaterally. No accessory muscle use. Abdomen: Soft, nontender, nondistended. Active  bowel sounds. No masses or hepatosplenomegaly  Skin: No rashes, lesions, or ulcerations.  Dry, warm to touch. 2+ dorsalis pedis and radial pulses. Musculoskeletal: No calf or leg pain. All major joints not erythematous nontender.  No upper or lower joint deformation.  Good ROM.  No contractures  Psychiatric: Intact judgment and insight. Pleasant and cooperative. Neurologic: No focal neurological deficits. Strength is 5/5 and symmetric in upper and lower extremities.  Cranial nerves II through XII are grossly intact.           Labs on Admission: I have personally reviewed following labs and imaging studies  CBC:  Recent Labs Lab 12/19/16 0842 12/19/16 0846  WBC 13.6*  --   HGB 16.0 16.3  HCT 48.4 48.0  MCV 94.2  --   PLT 265  --    Basic Metabolic Panel:  Recent Labs Lab 12/19/16 0842 12/19/16 0846  NA 141 143  K 4.7 4.7  CL 107 109  CO2 23  --   GLUCOSE 147* 142*  BUN 15 21*  CREATININE 1.26* 1.10  CALCIUM 8.9  --    GFR: Estimated Creatinine Clearance: 47.6 mL/min (by C-G formula based on SCr of 1.1 mg/dL). Liver Function Tests:  Recent Labs Lab 12/19/16 0842  AST 26  ALT 18  ALKPHOS 56  BILITOT 0.8  PROT 7.1  ALBUMIN 4.2   No results for input(s): LIPASE, AMYLASE in the last 168 hours. No results for input(s): AMMONIA in the last 168 hours. Coagulation Profile: No results for input(s): INR, PROTIME in the last 168 hours. Cardiac Enzymes: No results for input(s): CKTOTAL, CKMB, CKMBINDEX, TROPONINI in the last 168 hours. BNP (last 3 results) No results for input(s): PROBNP in the last 8760 hours. HbA1C: No results for input(s): HGBA1C in the last 72 hours. CBG: No results for input(s): GLUCAP in the last 168 hours. Lipid Profile: No results for input(s): CHOL, HDL, LDLCALC, TRIG, CHOLHDL, LDLDIRECT in the last 72 hours. Thyroid Function Tests: No results for input(s): TSH, T4TOTAL, FREET4, T3FREE, THYROIDAB in the last 72 hours. Anemia Panel: No  results for input(s): VITAMINB12, FOLATE, FERRITIN, TIBC, IRON, RETICCTPCT in the last 72 hours. Urine analysis:    Component Value Date/Time   COLORURINE COLORLESS (A) 12/19/2016 0924   APPEARANCEUR CLEAR 12/19/2016 0924   LABSPEC 1.005 12/19/2016 0924   PHURINE 5.0 12/19/2016 [place] 12/19/2016 0924   HGBUR NEGATIVE 12/19/2016 0924   BILIRUBINUR NEGATIVE 12/19/2016 0924   KETONESUR NEGATIVE 12/19/2016 0924   PROTEINUR NEGATIVE 12/19/2016 0924   NITRITE NEGATIVE 12/19/2016 0924   LEUKOCYTESUR NEGATIVE 12/19/2016 0924   Sepsis Labs: @LABRCNTIP (procalcitonin:4,lacticidven:4) )No results found for this or any previous visit (from the past 240 hour(s)).   Radiological Exams on Admission: Ct Angio Chest Pe W Or Wo Contrast  Result Date: 12/19/2016 CLINICAL DATA:  Acute onset dyspnea. EXAM: CT ANGIOGRAPHY CHEST WITH CONTRAST TECHNIQUE: Multidetector CT imaging of the chest was performed using the standard protocol during bolus administration of intravenous contrast. Multiplanar CT image reconstructions and MIPs were obtained to evaluate the vascular anatomy. CONTRAST:  100 cc Isovue 370 IV. COMPARISON:  Chest radiograph from earlier today. FINDINGS: Cardiovascular: The study is high quality for the evaluation of pulmonary embolism. There are no filling defects in the central, lobar, segmental or subsegmental pulmonary artery branches to suggest acute pulmonary embolism. Atherosclerotic nonaneurysmal thoracic aorta. Normal caliber pulmonary arteries. Normal heart size. No significant pericardial fluid/thickening. Left main, left anterior descending, left circumflex and right coronary atherosclerosis status post CABG. Mediastinum/Nodes: No discrete thyroid nodules. Small fluid level in the mid thoracic esophagus suggests esophageal dysmotility and/ or gastroesophageal reflux. No pathologically enlarged axillary, mediastinal or hilar lymph nodes. Lungs/Pleura: No pneumothorax. Small  dependent bilateral pleural effusions. Subsolid 1.1 cm basilar right upper lobe pulmonary nodule (series 7/ image 52). Interlobular septal thickening throughout both lungs. Mild-to-moderate centrilobular emphysema with diffuse bronchial wall thickening. Patchy consolidation with volume loss in the dependent lower lobes bilaterally. Upper abdomen: Subcentimeter hypodense lateral segment left liver lobe lesions, too small to characterize, which require no further follow up unless the patient has risk factors for liver malignancy. Cholelithiasis. Musculoskeletal: No aggressive appearing focal osseous lesions. Mild thoracic spondylosis. Sternotomy wires appear intact. Review of the MIP images confirms the above findings. IMPRESSION: 1. No pulmonary embolism. 2. Small dependent bilateral pleural effusions. Interlobular septal thickening in both lungs. These findings suggest acute congestive heart failure despite the normal heart size. 3. Mild-to-moderate centrilobular emphysema with diffuse bronchial wall thickening, suggesting COPD. 4. Sub solid 1.1 cm right upper lobe pulmonary nodule. Follow-up non-contrast CT recommended at 3-6 months to confirm persistence. If unchanged, and solid component remains <6 mm, annual CT is recommended until 5 years of stability has been established. If persistent these nodules should be considered highly suspicious if the solid component of the nodule is 6 mm or greater in size and enlarging. This recommendation follows the consensus statement: Guidelines for Management of Incidental Pulmonary Nodules Detected on CT Images: From the Fleischner Society 2017; Radiology 2017; 284:228-243. 5. Aortic atherosclerosis. Left main and 3 vessel coronary atherosclerosis status post CABG. 6. Cholelithiasis. Electronically Signed   By: Ilona Sorrel M.D.   On: 12/19/2016 10:54   Dg Chest Portable 1 View  Result Date: 12/19/2016 CLINICAL DATA:  Shortness of breath EXAM: PORTABLE CHEST 1 VIEW  COMPARISON:  None. FINDINGS: Mild cardiomegaly. Median sternotomy wires appear intact. Atherosclerotic changes noted at the aortic arch. There is central pulmonary vascular congestion and diffuse bilateral interstitial thickening which is presumed to be interstitial edema. No pleural effusion or pneumothorax seen. No acute or suspicious osseous finding. IMPRESSION: 1. Cardiomegaly with central pulmonary vascular congestion and diffuse bilateral interstitial thickening, presumed interstitial edema related to CHF. Without prior studies, I cannot exclude underlying chronic interstitial lung disease. 2.  Aortic atherosclerosis. Electronically Signed   By: Franki Cabot M.D.   On: 12/19/2016 08:58    EKG: Independently reviewed. Sinus rhythm with PR interval of 0.21. Nonspecific intraventricular conduction delay. No acute ST changes.  Assessment/Plan: Principal Problem:   Acute respiratory failure with hypoxia (HCC) Active Problems:   HTN (hypertension)   Acute on chronic systolic CHF (congestive heart failure) ([place])   Pulmonary nodule    This patient was discussed with the ED physician, including pertinent vitals, physical exam findings, labs, and imaging.  We also discussed care given by the ED provider.  #1 acute respiratory failure with hypoxia  Admit to stepdown as patient is currently on nitroglycerin drip  Continue nasal cannula #2 acute systolic heart failure Telemetry monitoring Strict I/O Daily Weights Diuresis: Lasix 60 mg IV twice daily Potassium: 40  mEq twice a day by mouth Echo cardiac exam tomorrow Repeat BMP tomorrow Hold beta blocker due to acute decompensation Wean nitro drip as able #3 hypertension  Continue nitrous drip  Continue lisinopril #4 pulmonary nodule  Requires follow-up in the next 3-6 months  DVT prophylaxis: Lovenox Consultants: None Code Status: Full code Family Communication: None  Disposition Plan: Admit to stepdown, should be able to return  home following admission.   Truett Mainland, DO [facility] Pager [phone]  If 7PM-7AM, please contact night-coverage [email or web address] Password [email or web address]

## 2016-12-19 NOTE — ED Provider Notes (Signed)
[facility] DEPT Provider Note   CSN: [id number] Arrival date & time: 12/19/16  2130     History   Chief Complaint Chief Complaint  Patient presents with  . Shortness of Breath    pt from home with severe SOB   Level V caveat secondary to respiratory distress and severity of illness  HPI Dean Bean is a 83 y.o. male.  HPI  83 year old man history of coronary artery disease presents today in acute respiratory distress.  Patient treated prehospital by EMS with BiPAP. They also gave albuterol. Patient continues acutely dyspneic. 10:42 AM Daughter bedside and patient more stable and able to obtain history. He has a history of coronary artery disease and had bypass surgery in the past. He has had one episode of flash pulmonary edema in the past. He was well this morning when he got out of bed and went down the kitchen. He became acutely dyspneic and called his daughter. He denies any chest pain. He has no history of pulmonary embolism or DVT.  Past Medical History:  Diagnosis Date  . Atrial fibrillation ([place])   . CAD (coronary artery disease)    s/p CABG  . COPD (chronic obstructive pulmonary disease) ([place])   . HTN (hypertension)   . Ischemic cardiomyopathy   . Mitral regurgitation   . Systolic CHF ([place])     There are no active problems to display for this patient.   Past Surgical History:  Procedure Laterality Date  . CORONARY ARTERY BYPASS GRAFT     4Vessel       Home Medications    Prior to Admission medications   Medication Sig Start Date End Date Taking? Authorizing Provider  aspirin EC 81 MG tablet Take 81 mg by mouth daily.    Historical Provider, MD  carvedilol (COREG) 6.25 MG tablet Take 6.25 mg by mouth 2 (two) times daily. 07/20/16   Historical Provider, MD  clopidogrel (PLAVIX) 75 MG tablet Take 75 mg by mouth daily. 07/15/16   Historical Provider, MD  lisinopril (PRINIVIL,ZESTRIL) 2.5 MG tablet Take 2.5 mg by mouth daily. 07/20/16   Historical  Provider, MD  simvastatin (ZOCOR) 20 MG tablet Take 20 mg by mouth daily. 07/20/16   Historical Provider, MD    Family History Family History  Problem Relation Age of Onset  . Cancer Mother   . Heart attack Father 24    Social History Social History  Substance Use Topics  . Smoking status: Former Smoker    Packs/day: 1.00    Years: 50.00    Types: Cigarettes    Quit date: 10/02/1996  . Smokeless tobacco: Never Used  . Alcohol use 4.2 oz/week    7 Glasses of wine per week     Allergies   Patient has no allergy information on record.   Review of Systems Review of Systems  Unable to perform ROS: Acuity of condition     Physical Exam Updated Vital Signs BP (!) 199/87 (BP Location: Right Arm)   Pulse 88   Resp (!) 35   Ht 5\' 7"  (1.702 m)   Wt 70.8 kg   SpO2 93%   BMI 24.43 kg/m   Physical Exam  Constitutional: He is oriented to person, place, and time. He appears well-developed. He appears distressed.  HENT:  Head: Normocephalic and atraumatic.  Eyes: EOM are normal. Pupils are equal, round, and reactive to light.  Neck: Normal range of motion.  Cardiovascular: Normal rate.   Pulmonary/Chest: He is in respiratory distress.  Diffuse rhonchi with some expiratory wheezing  Abdominal: Soft. Bowel sounds are normal.  Musculoskeletal:  Trace edema  Neurological: He is alert and oriented to person, place, and time.  Skin: Capillary refill takes less than 2 seconds.  Diaphoretic  Psychiatric: He has a normal mood and affect.  Nursing note and vitals reviewed.    ED Treatments / Results  Labs (all labs ordered are listed, but only abnormal results are displayed) Labs Reviewed - No data to display   ED ECG REPORT   Date: 12/19/2016  Rate: 82  Rhythm: normal sinus rhythm  QRS Axis: normal  Intervals: PR prolonged  ST/T Wave abnormalities: nonspecific ST changes  Conduction Disutrbances:nonspecific intraventricular conduction delay  Narrative  Interpretation:   Old EKG Reviewed: none available  I have personally reviewed the EKG tracing and agree with the computerized printout as noted.  Radiology Dg Chest Portable 1 View  Result Date: 12/19/2016 CLINICAL DATA:  Shortness of breath EXAM: PORTABLE CHEST 1 VIEW COMPARISON:  None. FINDINGS: Mild cardiomegaly. Median sternotomy wires appear intact. Atherosclerotic changes noted at the aortic arch. There is central pulmonary vascular congestion and diffuse bilateral interstitial thickening which is presumed to be interstitial edema. No pleural effusion or pneumothorax seen. No acute or suspicious osseous finding. IMPRESSION: 1. Cardiomegaly with central pulmonary vascular congestion and diffuse bilateral interstitial thickening, presumed interstitial edema related to CHF. Without prior studies, I cannot exclude underlying chronic interstitial lung disease. 2.  Aortic atherosclerosis. Electronically Signed   By: Franki Cabot M.D.   On: 12/19/2016 08:58    Procedures Procedures (including critical care time)  Medications Ordered in ED Medications  nitroGLYCERIN 50 mg in dextrose 5 % 250 mL (0.2 mg/mL) infusion (not administered)  furosemide (LASIX) injection 60 mg (60 mg Intravenous Given 12/19/16 5638)      Initial Impression / Assessment and Plan / ED Course  I have reviewed the triage vital signs and the nursing notes.  Pertinent labs & imaging results that were available during my care of the patient were reviewed by me and considered in my medical decision making (see chart for details).    9:32 AM Patient continues on BiPAP with nitro drip place. Systolic blood pressure 937. Sats upper 90s. Patient is able to speak in this in less respiratory distress than previous. Given elevated d-dimer, plan CT anterior.   12:07 PM Patient off BiPAP and tolerating nasal cannula with sats in the upper 90s. He is still on nitro drip systolic blood pressure 342. Plan admission CRITICAL  CARE Performed by: Shaune Pollack Total critical care time: 60 minutes Critical care time was exclusive of separately billable procedures and treating other patients. Critical care was necessary to treat or prevent imminent or life-threatening deterioration. Critical care was time spent personally by me on the following activities: development of treatment plan with patient and/or surrogate as well as nursing, discussions with consultants, evaluation of patient's response to treatment, examination of patient, obtaining history from patient or surrogate, ordering and performing treatments and interventions, ordering and review of laboratory studies, ordering and review of radiographic studies, pulse oximetry and re-evaluation of patient's condition.  Final Clinical Impressions(s) / ED Diagnoses   Discussed with Dr. Nehemiah Settle and he will admit.  Final diagnoses:  Acute pulmonary edema [facility])    New Prescriptions New Prescriptions   No medications on file     Pattricia Boss, MD 12/19/16 1217

## 2016-12-19 NOTE — ED Triage Notes (Signed)
Pt arrives on CPAP in resp distress pt is from home ER MD and RT to bedside pt not tolerating Bipap EMS called out for SOB on the commode

## 2016-12-19 NOTE — Progress Notes (Signed)
Patient transported to CT and back to trauma room A, on bipap, without complications.  Upon arrival back to room, took patient off of bipap and placed on 6L nasal cannula.  Patient is currently tolerating well.  Will continue to monitor.

## 2016-12-19 NOTE — ED Notes (Signed)
ER Provider at bedside pt appears to have improved tolerance to CPAP Port CXR at bedside IV med orders placed verbally

## 2016-12-19 NOTE — Progress Notes (Signed)
Does wear glasses

## 2016-12-19 NOTE — ED Notes (Signed)
Pt brought to and back from CT Appears to have improved symptoms RT at bedside pt taken off bipap tolerating well pt remains on monitor family at bedside

## 2016-12-19 NOTE — Progress Notes (Signed)
   Met with patient and family _0 .  Chaplain not needed at this time.  Will follow, as needed.  - Rev. Lead Hill MDiv ThM

## 2016-12-19 NOTE — ED Triage Notes (Signed)
Pt given in the field 125mg  IV solumedrol, 2gm Mag,CPAP, 15mg  Albuterol, 1mg  Atrovent

## 2016-12-19 NOTE — Progress Notes (Signed)
Patient arrived via EMS on their CPAP machine.  Patient was taken off of their machine and placed on our bipap machine.  Patient was noted to be using accessory muscles on arrival.  Sats were reading in the 80s.  Placed patient on 100% and higher support on bipap.  After a few minutes patient's breathing pattern began to improve, sats improved, and patient began to look more comfortable.  Will continue to monitor.

## 2016-12-19 NOTE — Progress Notes (Signed)
Admitted to 4 N via stretcher from ED. Denies Shortness of breath  No chest pain, ntg on 10 mcg.

## 2016-12-19 NOTE — Plan of Care (Signed)
Problem: Education: Goal: Knowledge of [facility] Education information/materials will improve Outcome: Progressing Informed of routine, call light, monitoring system.  Problem: Safety: Goal: Ability to remain free from injury will improve Outcome: Progressing Call not fall policy  Problem: Physical Regulation: Goal: Will remain free from infection Outcome: Progressing Gave chlorahexidine  [place] on arrival

## 2016-12-19 NOTE — ED Notes (Signed)
Pt arrives via EMS in severe Resp distress ER MD and RT to bedside

## 2016-12-20 LAB — BASIC METABOLIC PANEL
Anion gap: 8 (ref 5–15)
BUN: 23 mg/dL — AB (ref 6–20)
CALCIUM: 8.9 mg/dL (ref 8.9–10.3)
CO2: 24 mmol/L (ref 22–32)
Chloride: 107 mmol/L (ref 101–111)
Creatinine, Ser: 1.25 mg/dL — ABNORMAL HIGH (ref 0.61–1.24)
GFR calc Af Amer: 60 mL/min — ABNORMAL LOW (ref >60)
GFR calc non Af Amer: 51 mL/min — ABNORMAL LOW (ref >60)
GLUCOSE: 116 mg/dL — AB (ref 65–99)
POTASSIUM: 4 mmol/L (ref 3.5–5.1)
Sodium: 139 mmol/L (ref 135–145)

## 2016-12-20 MED ORDER — LISINOPRIL 5 MG PO TABS
5.0000 mg | ORAL_TABLET | Freq: Every day | ORAL | Status: DC
  Administered 2016-12-21: 5 mg via ORAL
  Filled 2016-12-20: qty 1

## 2016-12-20 MED ORDER — CARVEDILOL 6.25 MG PO TABS
6.2500 mg | ORAL_TABLET | Freq: Two times a day (BID) | ORAL | Status: DC
  Administered 2016-12-20 – 2016-12-21 (×2): 6.25 mg via ORAL
  Filled 2016-12-20 (×3): qty 1

## 2016-12-20 MED ORDER — HYDRALAZINE HCL 20 MG/ML IJ SOLN
10.0000 mg | INTRAMUSCULAR | Status: DC | PRN
Start: 2016-12-20 — End: 2016-12-21

## 2016-12-20 MED ORDER — FUROSEMIDE 40 MG PO TABS
60.0000 mg | ORAL_TABLET | Freq: Every day | ORAL | Status: DC
  Administered 2016-12-21: 60 mg via ORAL
  Filled 2016-12-20: qty 1

## 2016-12-20 NOTE — Progress Notes (Signed)
PROGRESS NOTE    Dean Bean  IWP:[id number] DOB: July 24, 1931 DOA: 12/19/2016 PCP: Vidal Schwalbe, MD   Brief Narrative:  83 year old male with past medical history of atrial fibrillation status post ablation, coronary artery disease status post CABG, hypertension, ischemic cardiomyopathy, diastolic CHF and COPD came to the ER with complaints of acute shortness of breath and found to be in volume overloaded. At the time his blood pressure was quite elevated as well. He was started on IV Lasix and was placed on nitro drip. He was also briefly placed on BiPAP due to hypoxia.    Assessment & Plan:   Principal Problem:   Acute respiratory failure with hypoxia (HCC) Active Problems:   HTN (hypertension)   Acute on chronic systolic CHF (congestive heart failure) (HCC)   Pulmonary nodule   Acute on chronic systolic congestive heart failure, stage IV, improving -His volume status has improved significantly along with his breathing at this time. His dietary compliance by eating [place] food with extra soy sauce likely put him in this situation. We'll get echocardiogram first prior to making any further adjustments and/or calling cardiology. Otherwise can follow-up outpatient with Dr. Angelena Form -We'll discontinue IV Lasix at this time and put him on by mouth daily which can be reassessed daily. He was not on diuretics before but this time he may need to go home on as needed basis at least  -Strict input and output, daily weight -Monitor electrolytes -Electrolytes as needed -Now that he is improved I will restart his Coreg and increase lisinopril from 2.5 mg to 5 mg given elevated blood pressure. Cont Asa and plavix  History of coronary artery disease with CABG and stents -Continue aspirin and Plavix, bowel medications as stated.  Hypertensive emergency with a history of hypertension, resolved -Discontinue nitroglycerin glycerin drip -Restart lisinopril at 5 mg daily. We will also restart  his Coreg 6.25 mg orally twice daily -Order IV hydralazine 10 mg as needed  Pulmonary nodule -Continue be followed up as an outpatient  History of atrial fibrillation -Continue current medication. He's not on any anticoagulation. If he goes back atrial fibrillation there is plans for outpatient cardiology to start him on anticoagulation. On ASA and plavix   COPD -Nebulizer treatment as needed  Peripheral vascular disease Stable continue current management  DVT prophylaxis:  Lovenox  Code Status:  full  Family Communication:   patient comprehends well  Disposition Plan: if he remains stable off nitroglycerin drip he can be transferred to telemetry   Consultants:   None  Procedures:   None  Antimicrobials:   None   Subjective: Patient states he's feeling a lot better this morning and things has shortness of breath and spring which resolved at this time. He is to follow with Dr. Nanda Quinton from cardiology but since he moved to the area he has seen Dr. Angelena Form once. He has been stable for months on current cardiac regimen but prior to his admission he had one [place] meal with extra soy sauce which he thinks likely put him in this. He ate it even though he knew not to eat high salt diet. He does not take any diuretics at home. We do not have any old echocardiogram available but according to him his ejection fraction in the past was anywhere between 45-55 percent about a year ago.   Objective: Vitals:   12/19/16 2300 12/20/16 0403 12/20/16 0738 12/20/16 1211  BP: (!) 117/57 (!) 146/61 (!) 178/67 (!) 165/78  Pulse: 74 79 63 73  Resp: 18 19 20  (!) 21  Temp: 97.9 F (36.6 C) 98.2 F (36.8 C) 97.7 F (36.5 C) 97.8 F (36.6 C)  TempSrc: Oral Oral Oral Oral  SpO2: 95% 95% 99% 97%  Weight:  67.8 kg (149 lb 8 oz)    Height:        Intake/Output Summary (Last 24 hours) at 12/20/16 1247 Last data filed at 12/20/16 1215  Gross per 24 hour  Intake           172.95 ml  Output              2215 ml  Net         -2042.05 ml   Filed Weights   12/19/16 0827 12/20/16 0403  Weight: 70.8 kg (156 lb) 67.8 kg (149 lb 8 oz)    Examination:  General exam: Appears calm and comfortable  Respiratory system: Clear to auscultation. Respiratory effort normal. Cardiovascular system: S1 & S2 heard, RRR. No JVD, murmurs, rubs, gallops or clicks. No pedal edema. Gastrointestinal system: Abdomen is nondistended, soft and nontender. No organomegaly or masses felt. Normal bowel sounds heard. Central nervous system: Alert and oriented. No focal neurological deficits. Extremities: Symmetric 5 x 5 power. Skin: No rashes, lesions or ulcers Psychiatry: Judgement and insight appear normal. Mood & affect appropriate.     Data Reviewed:   CBC:  Recent Labs Lab 12/19/16 0842 12/19/16 0846  WBC 13.6*  --   HGB 16.0 16.3  HCT 48.4 48.0  MCV 94.2  --   PLT 265  --    Basic Metabolic Panel:  Recent Labs Lab 12/19/16 0842 12/19/16 0846 12/20/16 0152  NA 141 143 139  K 4.7 4.7 4.0  CL 107 109 107  CO2 23  --  24  GLUCOSE 147* 142* 116*  BUN 15 21* 23*  CREATININE 1.26* 1.10 1.25*  CALCIUM 8.9  --  8.9   GFR: Estimated Creatinine Clearance: 41.9 mL/min (A) (by C-G formula based on SCr of 1.25 mg/dL (H)). Liver Function Tests:  Recent Labs Lab 12/19/16 0842  AST 26  ALT 18  ALKPHOS 56  BILITOT 0.8  PROT 7.1  ALBUMIN 4.2   No results for input(s): LIPASE, AMYLASE in the last 168 hours. No results for input(s): AMMONIA in the last 168 hours. Coagulation Profile: No results for input(s): INR, PROTIME in the last 168 hours. Cardiac Enzymes: No results for input(s): CKTOTAL, CKMB, CKMBINDEX, TROPONINI in the last 168 hours. BNP (last 3 results) No results for input(s): PROBNP in the last 8760 hours. HbA1C: No results for input(s): HGBA1C in the last 72 hours. CBG: No results for input(s): GLUCAP in the last 168 hours. Lipid Profile: No results for input(s):  CHOL, HDL, LDLCALC, TRIG, CHOLHDL, LDLDIRECT in the last 72 hours. Thyroid Function Tests: No results for input(s): TSH, T4TOTAL, FREET4, T3FREE, THYROIDAB in the last 72 hours. Anemia Panel: No results for input(s): VITAMINB12, FOLATE, FERRITIN, TIBC, IRON, RETICCTPCT in the last 72 hours. Sepsis Labs: No results for input(s): PROCALCITON, LATICACIDVEN in the last 168 hours.  Recent Results (from the past 240 hour(s))  MRSA PCR Screening     Status: None   Collection Time: 12/19/16  2:57 PM  Result Value Ref Range Status   MRSA by PCR NEGATIVE NEGATIVE Final    Comment:        The GeneXpert MRSA Assay (FDA approved for NASAL specimens only), is one component of a comprehensive MRSA colonization surveillance program. It is not  intended to diagnose MRSA infection nor to guide or monitor treatment for MRSA infections.          Radiology Studies: Ct Angio Chest Pe W Or Wo Contrast  Result Date: 12/19/2016 CLINICAL DATA:  Acute onset dyspnea. EXAM: CT ANGIOGRAPHY CHEST WITH CONTRAST TECHNIQUE: Multidetector CT imaging of the chest was performed using the standard protocol during bolus administration of intravenous contrast. Multiplanar CT image reconstructions and MIPs were obtained to evaluate the vascular anatomy. CONTRAST:  100 cc Isovue 370 IV. COMPARISON:  Chest radiograph from earlier today. FINDINGS: Cardiovascular: The study is high quality for the evaluation of pulmonary embolism. There are no filling defects in the central, lobar, segmental or subsegmental pulmonary artery branches to suggest acute pulmonary embolism. Atherosclerotic nonaneurysmal thoracic aorta. Normal caliber pulmonary arteries. Normal heart size. No significant pericardial fluid/thickening. Left main, left anterior descending, left circumflex and right coronary atherosclerosis status post CABG. Mediastinum/Nodes: No discrete thyroid nodules. Small fluid level in the mid thoracic esophagus suggests esophageal  dysmotility and/ or gastroesophageal reflux. No pathologically enlarged axillary, mediastinal or hilar lymph nodes. Lungs/Pleura: No pneumothorax. Small dependent bilateral pleural effusions. Subsolid 1.1 cm basilar right upper lobe pulmonary nodule (series 7/ image 52). Interlobular septal thickening throughout both lungs. Mild-to-moderate centrilobular emphysema with diffuse bronchial wall thickening. Patchy consolidation with volume loss in the dependent lower lobes bilaterally. Upper abdomen: Subcentimeter hypodense lateral segment left liver lobe lesions, too small to characterize, which require no further follow up unless the patient has risk factors for liver malignancy. Cholelithiasis. Musculoskeletal: No aggressive appearing focal osseous lesions. Mild thoracic spondylosis. Sternotomy wires appear intact. Review of the MIP images confirms the above findings. IMPRESSION: 1. No pulmonary embolism. 2. Small dependent bilateral pleural effusions. Interlobular septal thickening in both lungs. These findings suggest acute congestive heart failure despite the normal heart size. 3. Mild-to-moderate centrilobular emphysema with diffuse bronchial wall thickening, suggesting COPD. 4. Sub solid 1.1 cm right upper lobe pulmonary nodule. Follow-up non-contrast CT recommended at 3-6 months to confirm persistence. If unchanged, and solid component remains <6 mm, annual CT is recommended until 5 years of stability has been established. If persistent these nodules should be considered highly suspicious if the solid component of the nodule is 6 mm or greater in size and enlarging. This recommendation follows the consensus statement: Guidelines for Management of Incidental Pulmonary Nodules Detected on CT Images: From the Fleischner Society 2017; Radiology 2017; 284:228-243. 5. Aortic atherosclerosis. Left main and 3 vessel coronary atherosclerosis status post CABG. 6. Cholelithiasis. Electronically Signed   By: Ilona Sorrel  M.D.   On: 12/19/2016 10:54   Dg Chest Portable 1 View  Result Date: 12/19/2016 CLINICAL DATA:  Shortness of breath EXAM: PORTABLE CHEST 1 VIEW COMPARISON:  None. FINDINGS: Mild cardiomegaly. Median sternotomy wires appear intact. Atherosclerotic changes noted at the aortic arch. There is central pulmonary vascular congestion and diffuse bilateral interstitial thickening which is presumed to be interstitial edema. No pleural effusion or pneumothorax seen. No acute or suspicious osseous finding. IMPRESSION: 1. Cardiomegaly with central pulmonary vascular congestion and diffuse bilateral interstitial thickening, presumed interstitial edema related to CHF. Without prior studies, I cannot exclude underlying chronic interstitial lung disease. 2.  Aortic atherosclerosis. Electronically Signed   By: Franki Cabot M.D.   On: 12/19/2016 08:58        Scheduled Meds: . aspirin EC  81 mg Oral Daily  . clopidogrel  75 mg Oral Daily  . enoxaparin (LOVENOX) injection  40 mg Subcutaneous Q24H  .  furosemide  60 mg Intravenous BID  . lisinopril  2.5 mg Oral Daily  . potassium chloride  40 mEq Oral BID  . simvastatin  20 mg Oral Daily  . sodium chloride flush  3 mL Intravenous Q12H   Continuous Infusions: . nitroGLYCERIN Stopped (12/20/16 0351)     LOS: 1 day    Time spent: 35 mins     Ariston Grandison Arsenio Loader, MD [facility] Pager [phone]   If 7PM-7AM, please contact night-coverage [email or web address] Password [facility] 12/20/2016, 12:47 PM

## 2016-12-21 ENCOUNTER — Inpatient Hospital Stay (HOSPITAL_COMMUNITY): Payer: Medicare Other

## 2016-12-21 DIAGNOSIS — I509 Heart failure, unspecified: Secondary | ICD-10-CM

## 2016-12-21 DIAGNOSIS — J9601 Acute respiratory failure with hypoxia: Secondary | ICD-10-CM

## 2016-12-21 LAB — BASIC METABOLIC PANEL
Anion gap: 10 (ref 5–15)
BUN: 31 mg/dL — ABNORMAL HIGH (ref 6–20)
CHLORIDE: 105 mmol/L (ref 101–111)
CO2: 25 mmol/L (ref 22–32)
CREATININE: 1.28 mg/dL — AB (ref 0.61–1.24)
Calcium: 9 mg/dL (ref 8.9–10.3)
GFR calc non Af Amer: 50 mL/min — ABNORMAL LOW (ref >60)
GFR, EST AFRICAN AMERICAN: 58 mL/min — AB (ref >60)
Glucose, Bld: 86 mg/dL (ref 65–99)
POTASSIUM: 4.5 mmol/L (ref 3.5–5.1)
SODIUM: 140 mmol/L (ref 135–145)

## 2016-12-21 LAB — ECHOCARDIOGRAM COMPLETE
HEIGHTINCHES: 67 in
Weight: 2352 oz

## 2016-12-21 MED ORDER — LISINOPRIL 5 MG PO TABS: 5.0000 mg | ORAL_TABLET | Freq: Every day | ORAL | 0 refills | Status: DC

## 2016-12-21 MED ORDER — POTASSIUM CHLORIDE CRYS ER 20 MEQ PO TBCR: 20.0000 meq | EXTENDED_RELEASE_TABLET | Freq: Every day | ORAL | 0 refills | Status: DC

## 2016-12-21 MED ORDER — FUROSEMIDE 20 MG PO TABS: 60.0000 mg | ORAL_TABLET | Freq: Every day | ORAL | 0 refills | Status: DC

## 2016-12-21 NOTE — Discharge Summary (Signed)
Physician Discharge Summary  Dean Bean IHK:[id number] DOB: [birth year] DOA: 12/19/2016  PCP: Vidal Schwalbe, MD  Admit date: 12/19/2016 Discharge date: 12/21/2016  Admitted From: Home Disposition:  Home  Recommendations for Outpatient Follow-up:  1. Follow up with PCP in 1 week 2. Follow up with Cardiology Dr. Angelena Form in 1 week 3. Please obtain BMP/CBC in 1 week  4. Follow up on pulmonary nodule as recommended below   Home Health: No  Equipment/Devices: None   Discharge Condition: Stable CODE STATUS: Full  Diet recommendation: Heart healthy, low sodium   Brief/Interim Summary: From H&P by Dr. Nehemiah Settle: Dean Bean is a 83 y.o. male with a history of atrial fibrillation status post ablation, coronary artery disease status post four-vessel CABG approximately 4-5 years ago with stenting 2 years ago, hypertension, ischemic cardiomyopathy, chronic systolic heart failure, and COPD. Patient presents with sudden onset of shortness of breath with dyspnea on exertion. Patient awoke this morning feeling very uneasy in bed and having difficult time breathing. The patient related to the bathroom, but continued to have difficulty. EMS was called and the patient was brought to the hospital on CPAP with oxygen saturations in the 80s. The patient was placed on BiPAP here and immediately had appropriate response with his oxygenation. Patient was given Lasix, the patient's breathing improved. The patient came off the BiPAP has been supported on nasal cannula and is currently breathing comfortably.   Interim: He was treated with IV lasix which was transitioned to PO lasix. Echocardiogram was completed which showed EF 75-64%, grade 1 diastolic dysfunction, akinesis of the basal and mid inferolateral and basal   inferior walls. Hypertensive emergency was treated with nitroglycerin drip, improved, and home antihypertensives restarted.   Subjective on day of discharge: Doing well this morning. Denies  chest pain, shortness of breath, peripheral edema. Has been tolerating meals, ambulating well. Wants to go home.   Discharge Diagnoses:  Principal Problem:   Acute respiratory failure with hypoxia (HCC) Active Problems:   HTN (hypertension)   Acute on chronic systolic CHF (congestive heart failure) (HCC)   Pulmonary nodule   Acute on chronic systolic and diastolic congestive heart failure -His volume status has improved significantly with lasix therapy. I/Os not documented accurately, but weight 70.8kg --> 66.7kg. Lasix transitioned from IV to PO.  -Restart his Coreg and increase lisinopril from 2.5 mg to 5 mg given elevated blood pressure --Echo: EF 33-29%, grade 1 diastolic dysfunction, akinesis of the basal and mid inferolateral and basal   inferior walls --Need outpatient cardiology follow up  --Discussed low sodium, fluid restriction diet   Coronary artery disease with CABG and stents -Continue aspirin and Plavix  Hypertensive emergency with a history of hypertension, resolved -Discontinue nitroglycerin glycerin drip -Restart lisinopril at 5 mg daily. Restart his Coreg 6.25 mg orally twice daily  Pulmonary nodule -Continue be followed up as an outpatient --Sub solid 1.1 cm right upper lobe pulmonary nodule. Follow-up non-contrast CT recommended at 3-6 months to confirm persistence. If unchanged, and solid component remains <6 mm, annual CT is recommended until 5 years of stability has been established. If persistent these nodules should be considered highly suspicious if the solid component of the nodule is 6 mm or greater in size and enlarging.  History of atrial fibrillation -Continue current medication. He's not on any anticoagulation. If he goes back atrial fibrillation there is plans for outpatient cardiology to start him on anticoagulation. On ASA and plavix. Follow up outpatient.   COPD -Nebulizer treatment as needed  Peripheral vascular disease --Stable  continue current management, aspirin, plavix   Discharge Instructions  Discharge Instructions    (HEART FAILURE PATIENTS) Call MD:  Anytime you have any of the following symptoms: 1) 3 pound weight gain in 24 hours or 5 pounds in 1 week 2) shortness of breath, with or without a dry hacking cough 3) swelling in the hands, feet or stomach 4) if you have to sleep on extra pillows at night in order to breathe.    Complete by:  As directed    (HEART FAILURE PATIENTS) Call MD:  Anytime you have any of the following symptoms: 1) 3 pound weight gain in 24 hours or 5 pounds in 1 week 2) shortness of breath, with or without a dry hacking cough 3) swelling in the hands, feet or stomach 4) if you have to sleep on extra pillows at night in order to breathe.    Complete by:  As directed    Call MD for:  difficulty breathing, headache or visual disturbances    Complete by:  As directed    Call MD for:  difficulty breathing, headache or visual disturbances    Complete by:  As directed    Call MD for:  extreme fatigue    Complete by:  As directed    Call MD for:  extreme fatigue    Complete by:  As directed    Call MD for:  persistant dizziness or light-headedness    Complete by:  As directed    Call MD for:  persistant dizziness or light-headedness    Complete by:  As directed    Call MD for:  persistant nausea and vomiting    Complete by:  As directed    Call MD for:  persistant nausea and vomiting    Complete by:  As directed    Call MD for:  severe uncontrolled pain    Complete by:  As directed    Diet - low sodium heart healthy    Complete by:  As directed    Diet - low sodium heart healthy    Complete by:  As directed    Increase activity slowly    Complete by:  As directed    Increase activity slowly    Complete by:  As directed      Allergies as of 12/21/2016   No Known Allergies     Medication List    TAKE these medications   aspirin EC 81 MG tablet Take 81 mg by mouth daily.    carvedilol 6.25 MG tablet Commonly known as:  COREG Take 6.25 mg by mouth 2 (two) times daily.   clopidogrel 75 MG tablet Commonly known as:  PLAVIX Take 75 mg by mouth daily.   furosemide 20 MG tablet Commonly known as:  LASIX Take 3 tablets (60 mg total) by mouth daily. Start taking on:  12/22/2016   lisinopril 5 MG tablet Commonly known as:  PRINIVIL,ZESTRIL Take 1 tablet (5 mg total) by mouth daily. What changed:  medication strength  how much to take   NON FORMULARY at bedtime. CPAP   potassium chloride SA 20 MEQ tablet Commonly known as:  K-DUR,KLOR-CON Take 1 tablet (20 mEq total) by mouth daily.   simvastatin 20 MG tablet Commonly known as:  ZOCOR Take 20 mg by mouth daily.      Follow-up Information    WHITE,CYNTHIA S, MD. Schedule an appointment as soon as possible for a visit in 1 week(s).  Specialty:  Family Medicine Contact information: [address] [place] [address] [phone]        Lauree Chandler, MD. Schedule an appointment as soon as possible for a visit in 1 week(s).   Specialty:  Cardiology Contact information: [address] [address] [phone]          No Known Allergies  Consultations:  None   Procedures/Studies: Ct Angio Chest Pe W Or Wo Contrast  Result Date: 12/19/2016 CLINICAL DATA:  Acute onset dyspnea. EXAM: CT ANGIOGRAPHY CHEST WITH CONTRAST TECHNIQUE: Multidetector CT imaging of the chest was performed using the standard protocol during bolus administration of intravenous contrast. Multiplanar CT image reconstructions and MIPs were obtained to evaluate the vascular anatomy. CONTRAST:  100 cc Isovue 370 IV. COMPARISON:  Chest radiograph from earlier today. FINDINGS: Cardiovascular: The study is high quality for the evaluation of pulmonary embolism. There are no filling defects in the central, lobar, segmental or subsegmental pulmonary artery branches to suggest  acute pulmonary embolism. Atherosclerotic nonaneurysmal thoracic aorta. Normal caliber pulmonary arteries. Normal heart size. No significant pericardial fluid/thickening. Left main, left anterior descending, left circumflex and right coronary atherosclerosis status post CABG. Mediastinum/Nodes: No discrete thyroid nodules. Small fluid level in the mid thoracic esophagus suggests esophageal dysmotility and/ or gastroesophageal reflux. No pathologically enlarged axillary, mediastinal or hilar lymph nodes. Lungs/Pleura: No pneumothorax. Small dependent bilateral pleural effusions. Subsolid 1.1 cm basilar right upper lobe pulmonary nodule (series 7/ image 52). Interlobular septal thickening throughout both lungs. Mild-to-moderate centrilobular emphysema with diffuse bronchial wall thickening. Patchy consolidation with volume loss in the dependent lower lobes bilaterally. Upper abdomen: Subcentimeter hypodense lateral segment left liver lobe lesions, too small to characterize, which require no further follow up unless the patient has risk factors for liver malignancy. Cholelithiasis. Musculoskeletal: No aggressive appearing focal osseous lesions. Mild thoracic spondylosis. Sternotomy wires appear intact. Review of the MIP images confirms the above findings. IMPRESSION: 1. No pulmonary embolism. 2. Small dependent bilateral pleural effusions. Interlobular septal thickening in both lungs. These findings suggest acute congestive heart failure despite the normal heart size. 3. Mild-to-moderate centrilobular emphysema with diffuse bronchial wall thickening, suggesting COPD. 4. Sub solid 1.1 cm right upper lobe pulmonary nodule. Follow-up non-contrast CT recommended at 3-6 months to confirm persistence. If unchanged, and solid component remains <6 mm, annual CT is recommended until 5 years of stability has been established. If persistent these nodules should be considered highly suspicious if the solid component of the nodule  is 6 mm or greater in size and enlarging. This recommendation follows the consensus statement: Guidelines for Management of Incidental Pulmonary Nodules Detected on CT Images: From the Fleischner Society 2017; Radiology 2017; 284:228-243. 5. Aortic atherosclerosis. Left main and 3 vessel coronary atherosclerosis status post CABG. 6. Cholelithiasis. Electronically Signed   By: Ilona Sorrel M.D.   On: 12/19/2016 10:54   Dg Chest Portable 1 View  Result Date: 12/19/2016 CLINICAL DATA:  Shortness of breath EXAM: PORTABLE CHEST 1 VIEW COMPARISON:  None. FINDINGS: Mild cardiomegaly. Median sternotomy wires appear intact. Atherosclerotic changes noted at the aortic arch. There is central pulmonary vascular congestion and diffuse bilateral interstitial thickening which is presumed to be interstitial edema. No pleural effusion or pneumothorax seen. No acute or suspicious osseous finding. IMPRESSION: 1. Cardiomegaly with central pulmonary vascular congestion and diffuse bilateral interstitial thickening, presumed interstitial edema related to CHF. Without prior studies, I cannot exclude underlying chronic interstitial lung disease. 2.  Aortic atherosclerosis.  Electronically Signed   By: Franki Cabot M.D.   On: 12/19/2016 08:58    Echo - Left ventricle: The cavity size was normal. There was mild   concentric hypertrophy. Systolic function was mildly to   moderately reduced. The estimated ejection fraction was in the   range of 40% to 45%. Wall motion was normal; there were no   regional wall motion abnormalities. Doppler parameters are   consistent with abnormal left ventricular relaxation (grade 1   diastolic dysfunction). There was no evidence of elevated   ventricular filling pressure by Doppler parameters. - Aortic valve: Trileaflet; normal thickness leaflets. - Aortic root: The aortic root was normal in size. - Ascending aorta: The ascending aorta was normal in size. - Mitral valve: There was mild  regurgitation directed centrally. - Left atrium: The atrium was mildly dilated. - Right ventricle: The cavity size was normal. Wall thickness was   normal. Systolic function was normal. - Right atrium: The atrium was normal in size. - Tricuspid valve: There was trivial regurgitation. - Inferior vena cava: The vessel was normal in size. - Pericardium, extracardiac: There was no pericardial effusion.  Impressions:  - There is akinesis of the basal and mid inferolateral and basal   inferior walls. Overall LVEF is mildly impaired estimated at   40-45%.  RVEF is normal.    Discharge Exam: Vitals:   12/21/16 0955 12/21/16 1420  BP:  (!) 125/47  Pulse: 72 65  Resp:  20  Temp:  97.7 F (36.5 C)   Vitals:   12/21/16 0415 12/21/16 0954 12/21/16 0955 12/21/16 1420  BP: (!) 132/58 121/60  (!) 125/47  Pulse: 70  72 65  Resp: 18   20  Temp: 97.6 F (36.4 C)   97.7 F (36.5 C)  TempSrc: Oral   Oral  SpO2: 97%   99%  Weight: 66.7 kg (147 lb)     Height:        General: Pt is alert, awake, not in acute distress Cardiovascular: RRR, S1/S2 +, no rubs, no gallops Respiratory: CTA bilaterally, no wheezing, no rhonchi Abdominal: Soft, NT, ND, bowel sounds + Extremities: no edema, no cyanosis    The results of significant diagnostics from this hospitalization (including imaging, microbiology, ancillary and laboratory) are listed below for reference.     Microbiology: Recent Results (from the past 240 hour(s))  MRSA PCR Screening     Status: None   Collection Time: 12/19/16  2:57 PM  Result Value Ref Range Status   MRSA by PCR NEGATIVE NEGATIVE Final    Comment:        The GeneXpert MRSA Assay (FDA approved for NASAL specimens only), is one component of a comprehensive MRSA colonization surveillance program. It is not intended to diagnose MRSA infection nor to guide or monitor treatment for MRSA infections.      Labs: BNP (last 3 results)  Recent Labs   12/19/16 0842  BNP 161.0*   Basic Metabolic Panel:  Recent Labs Lab 12/19/16 0842 12/19/16 0846 12/20/16 0152 12/21/16 0208  NA 141 143 139 140  K 4.7 4.7 4.0 4.5  CL 107 109 107 105  CO2 23  --  24 25  GLUCOSE 147* 142* 116* 86  BUN 15 21* 23* 31*  CREATININE 1.26* 1.10 1.25* 1.28*  CALCIUM 8.9  --  8.9 9.0   Liver Function Tests:  Recent Labs Lab 12/19/16 0842  AST 26  ALT 18  ALKPHOS 56  BILITOT 0.8  PROT 7.1  ALBUMIN 4.2   No results for input(s): LIPASE, AMYLASE in the last 168 hours. No results for input(s): AMMONIA in the last 168 hours. CBC:  Recent Labs Lab 12/19/16 0842 12/19/16 0846  WBC 13.6*  --   HGB 16.0 16.3  HCT 48.4 48.0  MCV 94.2  --   PLT 265  --    Cardiac Enzymes: No results for input(s): CKTOTAL, CKMB, CKMBINDEX, TROPONINI in the last 168 hours. BNP: Invalid input(s): POCBNP CBG: No results for input(s): GLUCAP in the last 168 hours. D-Dimer  Recent Labs  12/19/16 0842  DDIMER 1.24*   Hgb A1c No results for input(s): HGBA1C in the last 72 hours. Lipid Profile No results for input(s): CHOL, HDL, LDLCALC, TRIG, CHOLHDL, LDLDIRECT in the last 72 hours. Thyroid function studies No results for input(s): TSH, T4TOTAL, T3FREE, THYROIDAB in the last 72 hours.  Invalid input(s): FREET3 Anemia work up No results for input(s): VITAMINB12, FOLATE, FERRITIN, TIBC, IRON, RETICCTPCT in the last 72 hours. Urinalysis    Component Value Date/Time   COLORURINE COLORLESS (A) 12/19/2016 0924   APPEARANCEUR CLEAR 12/19/2016 0924   LABSPEC 1.005 12/19/2016 0924   PHURINE 5.0 12/19/2016 0924   GLUCOSEU NEGATIVE 12/19/2016 0924   HGBUR NEGATIVE 12/19/2016 0924   BILIRUBINUR NEGATIVE 12/19/2016 0924   KETONESUR NEGATIVE 12/19/2016 0924   PROTEINUR NEGATIVE 12/19/2016 0924   NITRITE NEGATIVE 12/19/2016 0924   LEUKOCYTESUR NEGATIVE 12/19/2016 0924   Sepsis Labs Invalid input(s): PROCALCITONIN,  WBC,  LACTICIDVEN Microbiology Recent  Results (from the past 240 hour(s))  MRSA PCR Screening     Status: None   Collection Time: 12/19/16  2:57 PM  Result Value Ref Range Status   MRSA by PCR NEGATIVE NEGATIVE Final    Comment:        The GeneXpert MRSA Assay (FDA approved for NASAL specimens only), is one component of a comprehensive MRSA colonization surveillance program. It is not intended to diagnose MRSA infection nor to guide or monitor treatment for MRSA infections.      Time coordinating discharge: 45 minutes  SIGNED:  Dessa Phi, DO [facility] Pager [phone]  If 7PM-7AM, please contact night-coverage [email or web address] Password [email or web address] 12/21/2016, 3:28 PM

## 2016-12-21 NOTE — Progress Notes (Signed)
12/21/2016 5:31 PM Discharge AVS meds taken today and those due this evening reviewed.  Follow-up appointments and when to call md reviewed.  D/C IV and TELE.  Questions and concerns addressed.   D/C home per orders. [place]

## 2016-12-21 NOTE — Care Management Note (Signed)
Case Management Note Marvetta Gibbons RN, BSN Unit 2W-Case Manager [phone]  Patient Details  Name: Dean Bean MRN: [id number] Date of Birth: 28-Jan-1931  Subjective/Objective:  Pt admitted with acute resp. Failure & acute on chronic HF                   Action/Plan: PTA pt lived at home- anticipate return home, CM to follow for d/c needs  Expected Discharge Date:  12/21/16               Expected Discharge Plan:  Home/Self Care  In-House Referral:     Discharge planning Services  CM Consult  Post Acute Care Choice:  NA Choice offered to:  NA  DME Arranged:    DME Agency:     HH Arranged:    [place] Agency:     Status of Service:  Completed, signed off  If discussed at [employer] of Stay Meetings, dates discussed:    Additional Comments:  12/21/16- 1600- Bryton Waight RN, CM- pt for d/c today- no CM needs noted for discharge.   Dawayne Patricia, RN 12/21/2016, 4:00 PM

## 2016-12-21 NOTE — Discharge Instructions (Signed)
DASH Eating Plan DASH stands for "Dietary Approaches to Stop Hypertension." The DASH eating plan is a healthy eating plan that has been shown to reduce high blood pressure (hypertension). It may also reduce your risk for type 2 diabetes, heart disease, and stroke. The DASH eating plan may also help with weight loss. What are tips for following this plan? General guidelines  Avoid eating more than 2,300 mg (milligrams) of salt (sodium) a day. If you have hypertension, you may need to reduce your sodium intake to 1,500 mg a day.  Limit alcohol intake to no more than 1 drink a day for nonpregnant women and 2 drinks a day for men. One drink equals 12 oz of beer, 5 oz of wine, or 1 oz of hard liquor.  Work with your health care provider to maintain a healthy body weight or to lose weight. Ask what an ideal weight is for you.  Get at least 30 minutes of exercise that causes your heart to beat faster (aerobic exercise) most days of the week. Activities may include walking, swimming, or biking.  Work with your health care provider or diet and nutrition specialist (dietitian) to adjust your eating plan to your individual calorie needs. Reading food labels  Check food labels for the amount of sodium per serving. Choose foods with less than 5 percent of the Daily Value of sodium. Generally, foods with less than 300 mg of sodium per serving fit into this eating plan.  To find whole grains, look for the word "whole" as the first word in the ingredient list. Shopping  Buy products labeled as "low-sodium" or "no salt added."  Buy fresh foods. Avoid canned foods and premade or frozen meals. Cooking  Avoid adding salt when cooking. Use salt-free seasonings or herbs instead of table salt or sea salt. Check with your health care provider or pharmacist before using salt substitutes.  Do not fry foods. Cook foods using healthy methods such as baking, boiling, grilling, and broiling instead.  Cook with  heart-healthy oils, such as olive, canola, soybean, or sunflower oil. Meal planning   Eat a balanced diet that includes: ? 5 or more servings of fruits and vegetables each day. At each meal, try to fill half of your plate with fruits and vegetables. ? Up to 6-8 servings of whole grains each day. ? Less than 6 oz of lean meat, poultry, or fish each day. A 3-oz serving of meat is about the same size as a deck of cards. One egg equals 1 oz. ? 2 servings of low-fat dairy each day. ? A serving of nuts, seeds, or beans 5 times each week. ? Heart-healthy fats. Healthy fats called Omega-3 fatty acids are found in foods such as flaxseeds and coldwater fish, like sardines, salmon, and mackerel.  Limit how much you eat of the following: ? Canned or prepackaged foods. ? Food that is high in trans fat, such as fried foods. ? Food that is high in saturated fat, such as fatty meat. ? Sweets, desserts, sugary drinks, and other foods with added sugar. ? Full-fat dairy products.  Do not salt foods before eating.  Try to eat at least 2 vegetarian meals each week.  Eat more home-cooked food and less restaurant, buffet, and fast food.  When eating at a restaurant, ask that your food be prepared with less salt or no salt, if possible. What foods are recommended? The items listed may not be a complete list. Talk with your dietitian about what   dietary choices are best for you. Grains Whole-grain or whole-wheat bread. Whole-grain or whole-wheat pasta. Brown rice. Oatmeal. Quinoa. Bulgur. Whole-grain and low-sodium cereals. Pita bread. Low-fat, low-sodium crackers. Whole-wheat flour tortillas. Vegetables Fresh or frozen vegetables (raw, steamed, roasted, or grilled). Low-sodium or reduced-sodium tomato and vegetable juice. Low-sodium or reduced-sodium tomato sauce and tomato paste. Low-sodium or reduced-sodium canned vegetables. Fruits All fresh, dried, or frozen fruit. Canned fruit in natural juice (without  added sugar). Meat and other protein foods Skinless chicken or turkey. Ground chicken or turkey. Pork with fat trimmed off. Fish and seafood. Egg whites. Dried beans, peas, or lentils. Unsalted nuts, nut butters, and seeds. Unsalted canned beans. Lean cuts of beef with fat trimmed off. Low-sodium, lean deli meat. Dairy Low-fat (1%) or fat-free (skim) milk. Fat-free, low-fat, or reduced-fat cheeses. Nonfat, low-sodium ricotta or cottage cheese. Low-fat or nonfat yogurt. Low-fat, low-sodium cheese. Fats and oils Soft margarine without trans fats. Vegetable oil. Low-fat, reduced-fat, or light mayonnaise and salad dressings (reduced-sodium). Canola, safflower, olive, soybean, and sunflower oils. Avocado. Seasoning and other foods Herbs. Spices. Seasoning mixes without salt. Unsalted popcorn and pretzels. Fat-free sweets. What foods are not recommended? The items listed may not be a complete list. Talk with your dietitian about what dietary choices are best for you. Grains Baked goods made with fat, such as croissants, muffins, or some breads. Dry pasta or rice meal packs. Vegetables Creamed or fried vegetables. Vegetables in a cheese sauce. Regular canned vegetables (not low-sodium or reduced-sodium). Regular canned tomato sauce and paste (not low-sodium or reduced-sodium). Regular tomato and vegetable juice (not low-sodium or reduced-sodium). Pickles. Olives. Fruits Canned fruit in a light or heavy syrup. Fried fruit. Fruit in cream or butter sauce. Meat and other protein foods Fatty cuts of meat. Ribs. Fried meat. Bacon. Sausage. Bologna and other processed lunch meats. Salami. Fatback. Hotdogs. Bratwurst. Salted nuts and seeds. Canned beans with added salt. Canned or smoked fish. Whole eggs or egg yolks. Chicken or turkey with skin. Dairy Whole or 2% milk, cream, and half-and-half. Whole or full-fat cream cheese. Whole-fat or sweetened yogurt. Full-fat cheese. Nondairy creamers. Whipped toppings.  Processed cheese and cheese spreads. Fats and oils Butter. Stick margarine. Lard. Shortening. Ghee. Bacon fat. Tropical oils, such as coconut, palm kernel, or palm oil. Seasoning and other foods Salted popcorn and pretzels. Onion salt, garlic salt, seasoned salt, table salt, and sea salt. Worcestershire sauce. Tartar sauce. Barbecue sauce. Teriyaki sauce. Soy sauce, including reduced-sodium. Steak sauce. Canned and packaged gravies. Fish sauce. Oyster sauce. Cocktail sauce. Horseradish that you find on the shelf. Ketchup. Mustard. Meat flavorings and tenderizers. Bouillon cubes. Hot sauce and Tabasco sauce. Premade or packaged marinades. Premade or packaged taco seasonings. Relishes. Regular salad dressings. Where to find more information:  National Heart, Lung, and Blood Institute: www.nhlbi.nih.gov  American Heart Association: www.heart.org Summary  The DASH eating plan is a healthy eating plan that has been shown to reduce high blood pressure (hypertension). It may also reduce your risk for type 2 diabetes, heart disease, and stroke.  With the DASH eating plan, you should limit salt (sodium) intake to 2,300 mg a day. If you have hypertension, you may need to reduce your sodium intake to 1,500 mg a day.  When on the DASH eating plan, aim to eat more fresh fruits and vegetables, whole grains, lean proteins, low-fat dairy, and heart-healthy fats.  Work with your health care provider or diet and nutrition specialist (dietitian) to adjust your eating plan to your individual   calorie needs. This information is not intended to replace advice given to you by your health care provider. Make sure you discuss any questions you have with your health care provider. Document Released: 08/20/2011 Document Revised: 08/24/2016 Document Reviewed: 08/24/2016 Elsevier Interactive Patient Education  2017 Elsevier Inc.  

## 2016-12-21 NOTE — Progress Notes (Signed)
  Echocardiogram 2D Echocardiogram has been performed.  Jennette Dubin 12/21/2016, 2:00 PM

## 2016-12-24 ENCOUNTER — Ambulatory Visit (INDEPENDENT_AMBULATORY_CARE_PROVIDER_SITE_OTHER): Payer: Medicare Other | Admitting: Cardiovascular Disease

## 2016-12-24 ENCOUNTER — Encounter: Payer: Self-pay | Admitting: Cardiovascular Disease

## 2016-12-24 VITALS — BP 130/64 | HR 72 | Ht 68.0 in | Wt 150.4 lb

## 2016-12-24 DIAGNOSIS — I2510 Atherosclerotic heart disease of native coronary artery without angina pectoris: Secondary | ICD-10-CM | POA: Diagnosis not present

## 2016-12-24 DIAGNOSIS — I255 Ischemic cardiomyopathy: Secondary | ICD-10-CM | POA: Diagnosis not present

## 2016-12-24 DIAGNOSIS — I5022 Chronic systolic (congestive) heart failure: Secondary | ICD-10-CM | POA: Diagnosis not present

## 2016-12-24 MED ORDER — FUROSEMIDE 20 MG PO TABS: 20.0000 mg | ORAL_TABLET | Freq: Every day | ORAL | 6 refills | Status: DC

## 2016-12-24 NOTE — Patient Instructions (Signed)
Medication Instructions:  .Your physician has recommended you make the following change in your medication:  Decrease furosemide to 20 mg by mouth daily.    Labwork: Lab work to be done today--BMP  Testing/Procedures: none  Follow-Up: Your physician recommends that you schedule a follow-up appointment in: 6 months. Please call our office in about 3 months to schedule this appointment.     Any Other Special Instructions Will Be Listed Below (If Applicable).     If you need a refill on your cardiac medications before your next appointment, please call your pharmacy.

## 2016-12-24 NOTE — Progress Notes (Signed)
Chief Complaint  Patient presents with  . Follow-up    History of Present Illness: 83 yo male with history of CAD s/p 4V CABG in 2013, atrial flutter/fib s/p ablation 2013, ischemic cardiomyopathy, chronic systolic CHF, COPD who is here today for cardiac follow up. I met him as a new patient to establish cardiology care on 10/02/16. He has been followed in [place] by Dr. Rozetta Nunnery but recently moved to [facility]. Cardiac history includes 4V CABG in November 2013 (LIMA to LAD, SVG to OM, SVG to PDA, SVG to Diagonal) and stenting of the left main artery in 2016 (DES placed from left main into the diagonal not protected by LIMA graft). He had an atrial flutter ablation in 2013. He had an echo in 2016 with normal LVEF and mild MR. He is also known to have PAD. He was told he had blockages in his legs and was seen by vascular surgery but he says they said there was nothing to worry about. Former smoker (50 pack years, stopped smoking in 1998). He was admitted to [facility] April 2018 with dyspnea and was felt to be volume overloaded. He was diuresed with IV Lasix and had rapid improvement in his dyspnea. Echo 12/21/16 with LVEF=40-45% with mild MR. He was discharged on oral Lasix.   He is here today for follow up. The patient denies any chest pain. He has had no dyspnea since discharge from [facility]. No palpitations, lower extremity edema, orthopnea, PND, dizziness, near syncope or syncope.    Primary Care Physician: Vidal Schwalbe, MD   Past Medical History:  Diagnosis Date  . Atrial fibrillation ([place])   . CAD (coronary artery disease)    s/p CABG  . COPD (chronic obstructive pulmonary disease) ([place])   . HTN (hypertension)   . Ischemic cardiomyopathy   . Mitral regurgitation   . Systolic CHF [facility])     Past Surgical History:  Procedure Laterality Date  . CORONARY ARTERY BYPASS GRAFT     4Vessel    Current Outpatient Prescriptions  Medication Sig Dispense Refill  . aspirin  EC 81 MG tablet Take 81 mg by mouth daily.    . carvedilol (COREG) 6.25 MG tablet Take 6.25 mg by mouth 2 (two) times daily.    . clopidogrel (PLAVIX) 75 MG tablet Take 75 mg by mouth daily.    Marland Kitchen lisinopril (PRINIVIL,ZESTRIL) 5 MG tablet Take 1 tablet (5 mg total) by mouth daily. 30 tablet 0  . NON FORMULARY at bedtime. CPAP    . potassium chloride SA (K-DUR,KLOR-CON) 20 MEQ tablet Take 1 tablet (20 mEq total) by mouth daily. 30 tablet 0  . simvastatin (ZOCOR) 20 MG tablet Take 20 mg by mouth daily.    . furosemide (LASIX) 20 MG tablet Take 1 tablet (20 mg total) by mouth daily. 30 tablet 6   No current facility-administered medications for this visit.     No known drug allergies  Social History   Social History  . Marital status: Married    Spouse name: N/A  . Number of children: 3  . Years of education: N/A   Occupational History  . Model maker for [employer]    Social History Main Topics  . Smoking status: Former Smoker    Packs/day: 1.00    Years: 50.00    Types: Cigarettes    Quit date: 10/02/1996  . Smokeless tobacco: Never Used  . Alcohol use 4.2 oz/week    7 Glasses of wine per  week  . Drug use: No  . Sexual activity: Not on file   Other Topics Concern  . Not on file   Social History Narrative  . No narrative on file    Family History  Problem Relation Age of Onset  . Cancer Mother   . Heart attack Father 48    Review of Systems:  As stated in the HPI and otherwise negative.   BP 130/64   Pulse 72   Ht '5\' 8"'  (1.727 m)   Wt 150 lb 6.4 oz (68.2 kg)   SpO2 98%   BMI 22.87 kg/m   Physical Examination:  General: Well developed, well nourished, NAD  HEENT: OP clear, mucus membranes moist  SKIN: warm, dry. No rashes. Neuro: No focal deficits  Musculoskeletal: Muscle strength 5/5 all ext  Psychiatric: Mood and affect normal  Neck: No JVD, no carotid bruits, no thyromegaly, no lymphadenopathy.  Lungs:Clear bilaterally, no wheezes, rhonci,  crackles Cardiovascular: Regular rate and rhythm. No murmurs, gallops or rubs. Abdomen:Soft. Bowel sounds present. Non-tender.  Extremities: No lower extremity edema. Pulses are 2 + in the bilateral DP/PT.  Echo 12/21/16: Left ventricle: The cavity size was normal. There was mild   concentric hypertrophy. Systolic function was mildly to   moderately reduced. The estimated ejection fraction was in the   range of 40% to 45%. Wall motion was normal; there were no   regional wall motion abnormalities. Doppler parameters are   consistent with abnormal left ventricular relaxation (grade 1   diastolic dysfunction). There was no evidence of elevated   ventricular filling pressure by Doppler parameters. - Aortic valve: Trileaflet; normal thickness leaflets. - Aortic root: The aortic root was normal in size. - Ascending aorta: The ascending aorta was normal in size. - Mitral valve: There was mild regurgitation directed centrally. - Left atrium: The atrium was mildly dilated. - Right ventricle: The cavity size was normal. Wall thickness was   normal. Systolic function was normal. - Right atrium: The atrium was normal in size. - Tricuspid valve: There was trivial regurgitation. - Inferior vena cava: The vessel was normal in size. - Pericardium, extracardiac: There was no pericardial effusion.  Impressions:  - There is akinesis of the basal and mid inferolateral and basal   inferior walls. Overall LVEF is mildly impaired estimated at   40-45%.   RVEF is normal.   EKG:  EKG is not ordered today. The ekg ordered today demonstrates   Recent Labs: 12/19/2016: ALT 18; B Natriuretic Peptide 756.2; Hemoglobin 16.3; Platelets 265 12/21/2016: BUN 31; Creatinine, Ser 1.28; Potassium 4.5; Sodium 140   Lipid Panel No results found for: CHOL, TRIG, HDL, CHOLHDL, VLDL, LDLCALC, LDLDIRECT   Wt Readings from Last 3 Encounters:  12/24/16 150 lb 6.4 oz (68.2 kg)  12/21/16 147 lb (66.7 kg)  10/02/16 156  lb 3.2 oz (70.9 kg)     Other studies Reviewed: Additional studies/ records that were reviewed today include: . Review of the above records demonstrates:   Assessment and Plan:   1. CAD s/p CABG without angina: He is having no chest pain suggestive of angina. He had CABG in 2013 and stenting of the left main into the Diagonal branch (unprotected by graft) in 2016. Will continue ASA, Plavix, statin, beta blocker.   2. Ischemic cardiomyopathy: LVEF normal by echo December 2016. Will continue beta blocker and Ace-inh.    3. Chronic systolic CHF: Volume status is ok today. Will reduce Lasix to 20 mg per day.  Continue KDur. Will check BMET today. He will weigh himself every day and use extra Lasix as needed for increased weight.   4. PAD: He has known PAD by non-invasive studies at [facility] in 2014 with ABI 0.76 on the left and 0.46 on the right. We have discussed repeating non-invasive studies now but he wishes to wait. He will call if he begins having pain in his legs.   5. Atrial fibrillation/flutter: He is s/p ablation. Sinus today. He is on ASA and Plavix. He has not been on anti-coagulation since he has had no recurrence of atrial fibrillation post ablation.    6. Former tobacco abuse: He stopped smoking in 1998.   7. Mitral regurgitation: mild by echo December 2016. Will not need repeat echo at this time.   Current medicines are reviewed at length with the patient today.  The patient does not have concerns regarding medicines.  The following changes have been made:  no change  Labs/ tests ordered today include:   Orders Placed This Encounter  Procedures  . Basic Metabolic Panel (BMET)     Disposition:   FU with me in 6 months   Signed, Lauree Chandler, MD 12/24/2016 9:56 AM    [facility] [address], [place], [place]  [address] Phone: [phone]; Fax: [phone]

## 2016-12-25 ENCOUNTER — Other Ambulatory Visit: Payer: Self-pay | Admitting: *Deleted

## 2016-12-25 DIAGNOSIS — I5022 Chronic systolic (congestive) heart failure: Secondary | ICD-10-CM

## 2016-12-25 LAB — BASIC METABOLIC PANEL
BUN / CREAT RATIO: 34 — AB (ref 10–24)
BUN: 58 mg/dL — AB (ref 8–27)
CO2: 23 mmol/L (ref 18–29)
CREATININE: 1.73 mg/dL — AB (ref 0.76–1.27)
Calcium: 9.3 mg/dL (ref 8.6–10.2)
Chloride: 98 mmol/L (ref 96–106)
GFR, EST AFRICAN AMERICAN: 41 mL/min/{1.73_m2} — AB (ref >59)
GFR, EST NON AFRICAN AMERICAN: 36 mL/min/{1.73_m2} — AB (ref >59)
Glucose: 94 mg/dL (ref 65–99)
Potassium: 5.2 mmol/L (ref 3.5–5.2)
Sodium: 137 mmol/L (ref 134–144)

## 2016-12-28 DIAGNOSIS — I509 Heart failure, unspecified: Secondary | ICD-10-CM | POA: Diagnosis not present

## 2016-12-28 DIAGNOSIS — R911 Solitary pulmonary nodule: Secondary | ICD-10-CM | POA: Diagnosis not present

## 2016-12-29 ENCOUNTER — Other Ambulatory Visit: Payer: Medicare Other

## 2016-12-29 DIAGNOSIS — I5022 Chronic systolic (congestive) heart failure: Secondary | ICD-10-CM

## 2016-12-29 LAB — BASIC METABOLIC PANEL
BUN/Creatinine Ratio: 29 — ABNORMAL HIGH (ref 10–24)
BUN: 44 mg/dL — AB (ref 8–27)
CALCIUM: 9.2 mg/dL (ref 8.6–10.2)
CO2: 20 mmol/L (ref 18–29)
CREATININE: 1.53 mg/dL — AB (ref 0.76–1.27)
Chloride: 103 mmol/L (ref 96–106)
GFR, EST AFRICAN AMERICAN: 48 mL/min/{1.73_m2} — AB (ref >59)
GFR, EST NON AFRICAN AMERICAN: 41 mL/min/{1.73_m2} — AB (ref >59)
Glucose: 98 mg/dL (ref 65–99)
POTASSIUM: 4.8 mmol/L (ref 3.5–5.2)
Sodium: 138 mmol/L (ref 134–144)

## 2017-04-01 ENCOUNTER — Other Ambulatory Visit: Payer: Self-pay | Admitting: Family Medicine

## 2017-04-01 DIAGNOSIS — R911 Solitary pulmonary nodule: Secondary | ICD-10-CM

## 2017-04-02 ENCOUNTER — Ambulatory Visit
Admission: RE | Admit: 2017-04-02 | Discharge: 2017-04-02 | Disposition: A | Discharge status: Home / Self Care | Payer: Medicare Other | Source: Ambulatory Visit | Attending: Family Medicine | Admitting: Family Medicine

## 2017-04-02 DIAGNOSIS — R911 Solitary pulmonary nodule: Secondary | ICD-10-CM | POA: Diagnosis not present

## 2017-05-07 ENCOUNTER — Ambulatory Visit: Payer: Medicare Other | Admitting: Cardiovascular Disease

## 2017-05-12 DIAGNOSIS — I10 Essential (primary) hypertension: Secondary | ICD-10-CM | POA: Diagnosis not present

## 2017-05-12 DIAGNOSIS — I739 Peripheral vascular disease, unspecified: Secondary | ICD-10-CM | POA: Diagnosis not present

## 2017-05-12 DIAGNOSIS — N289 Disorder of kidney and ureter, unspecified: Secondary | ICD-10-CM | POA: Diagnosis not present

## 2017-05-12 DIAGNOSIS — Z0000 Encounter for general adult medical examination without abnormal findings: Secondary | ICD-10-CM | POA: Diagnosis not present

## 2017-05-12 DIAGNOSIS — I2510 Atherosclerotic heart disease of native coronary artery without angina pectoris: Secondary | ICD-10-CM | POA: Diagnosis not present

## 2017-05-12 DIAGNOSIS — E785 Hyperlipidemia, unspecified: Secondary | ICD-10-CM | POA: Diagnosis not present

## 2017-05-12 DIAGNOSIS — K4090 Unilateral inguinal hernia, without obstruction or gangrene, not specified as recurrent: Secondary | ICD-10-CM | POA: Diagnosis not present

## 2017-05-12 DIAGNOSIS — Z23 Encounter for immunization: Secondary | ICD-10-CM | POA: Diagnosis not present

## 2017-05-31 ENCOUNTER — Encounter: Payer: Self-pay | Admitting: Cardiovascular Disease

## 2017-05-31 ENCOUNTER — Ambulatory Visit (INDEPENDENT_AMBULATORY_CARE_PROVIDER_SITE_OTHER): Payer: Medicare Other | Admitting: Cardiovascular Disease

## 2017-05-31 VITALS — BP 136/70 | HR 65 | Ht 68.0 in | Wt 155.8 lb

## 2017-05-31 DIAGNOSIS — I2510 Atherosclerotic heart disease of native coronary artery without angina pectoris: Secondary | ICD-10-CM

## 2017-05-31 DIAGNOSIS — I739 Peripheral vascular disease, unspecified: Secondary | ICD-10-CM

## 2017-05-31 DIAGNOSIS — I480 Paroxysmal atrial fibrillation: Secondary | ICD-10-CM

## 2017-05-31 DIAGNOSIS — I255 Ischemic cardiomyopathy: Secondary | ICD-10-CM

## 2017-05-31 DIAGNOSIS — I5022 Chronic systolic (congestive) heart failure: Secondary | ICD-10-CM

## 2017-05-31 NOTE — Progress Notes (Signed)
Chief Complaint  Patient presents with  . Follow-up    CAD   History of Present Illness: 83 yo male with history of CAD s/p 4V CABG in 2013, atrial flutter/fib s/p ablation 2013, ischemic cardiomyopathy, chronic systolic CHF, COPD who is here today for cardiac follow up. I met him as a new patient to establish cardiology care on 10/02/16. He has been followed in [place] by Dr. Rozetta Nunnery but moved to [facility] at the end of 2017. Cardiac history includes 4V CABG in November 2013 (LIMA to LAD, SVG to OM, SVG to PDA, SVG to Diagonal) and stenting of the left main artery in 2016 (DES placed from left main into the diagonal not protected by LIMA graft). He had an atrial flutter ablation in 2013. He had an echo in 2016 with normal LVEF and mild MR. He is also known to have PAD. He was told he had blockages in his legs and was seen by vascular surgery but he says they said there was nothing to worry about. Former smoker (50 pack years, stopped smoking in 1998). He was admitted to [facility] April 2018 with dyspnea and was felt to be volume overloaded. He was diuresed with IV Lasix and had rapid improvement in his dyspnea. Echo 12/21/16 with LVEF=40-45% with mild MR. He was discharged on oral Lasix.   He is here today for follow up of his CAD. The patient denies any chest pain, dyspnea, palpitations, lower extremity edema, orthopnea, PND, dizziness, near syncope or syncope. ;   Primary Care Physician: Harlan Stains, MD   Past Medical History:  Diagnosis Date  . Atrial fibrillation ([place])   . CAD (coronary artery disease)    s/p CABG  . COPD (chronic obstructive pulmonary disease) ([place])   . HTN (hypertension)   . Ischemic cardiomyopathy   . Mitral regurgitation   . Systolic CHF [facility])     Past Surgical History:  Procedure Laterality Date  . CORONARY ARTERY BYPASS GRAFT     4Vessel    Current Outpatient Prescriptions  Medication Sig Dispense Refill  . aspirin EC 81 MG tablet  Take 81 mg by mouth daily.    . carvedilol (COREG) 6.25 MG tablet Take 6.25 mg by mouth 2 (two) times daily.    . clopidogrel (PLAVIX) 75 MG tablet Take 75 mg by mouth daily.    . furosemide (LASIX) 20 MG tablet Take 1 tablet (20 mg total) by mouth daily. 30 tablet 6  . lisinopril (PRINIVIL,ZESTRIL) 5 MG tablet Take 1 tablet (5 mg total) by mouth daily. 30 tablet 0  . NON FORMULARY at bedtime. CPAP    . simvastatin (ZOCOR) 20 MG tablet Take 20 mg by mouth daily.     No current facility-administered medications for this visit.     No known drug allergies  Social History   Social History  . Marital status: Married    Spouse name: N/A  . Number of children: 3  . Years of education: N/A   Occupational History  . Model maker for [employer]    Social History Main Topics  . Smoking status: Former Smoker    Packs/day: 1.00    Years: 50.00    Types: Cigarettes    Quit date: 10/02/1996  . Smokeless tobacco: Never Used  . Alcohol use 4.2 oz/week    7 Glasses of wine per week  . Drug use: No  . Sexual activity: Not on file   Other Topics Concern  .  Not on file   Social History Narrative  . No narrative on file    Family History  Problem Relation Age of Onset  . Cancer Mother   . Heart attack Father 45    Review of Systems:  As stated in the HPI and otherwise negative.   BP 136/70   Pulse 65   Ht '5\' 8"'  (1.727 m)   Wt 155 lb 12.8 oz (70.7 kg)   SpO2 94%   BMI 23.69 kg/m   Physical Examination:  General: Well developed, well nourished, NAD  HEENT: OP clear, mucus membranes moist  SKIN: warm, dry. No rashes. Neuro: No focal deficits  Musculoskeletal: Muscle strength 5/5 all ext  Psychiatric: Mood and affect normal  Neck: No JVD, no carotid bruits, no thyromegaly, no lymphadenopathy.  Lungs:Clear bilaterally, no wheezes, rhonci, crackles Cardiovascular: Regular rate and rhythm. No murmurs, gallops or rubs. Abdomen:Soft. Bowel sounds present. Non-tender.    Extremities: No lower extremity edema. Pulses are 2 + in the bilateral DP/PT.  Echo 12/21/16: Left ventricle: The cavity size was normal. There was mild   concentric hypertrophy. Systolic function was mildly to   moderately reduced. The estimated ejection fraction was in the   range of 40% to 45%. Wall motion was normal; there were no   regional wall motion abnormalities. Doppler parameters are   consistent with abnormal left ventricular relaxation (grade 1   diastolic dysfunction). There was no evidence of elevated   ventricular filling pressure by Doppler parameters. - Aortic valve: Trileaflet; normal thickness leaflets. - Aortic root: The aortic root was normal in size. - Ascending aorta: The ascending aorta was normal in size. - Mitral valve: There was mild regurgitation directed centrally. - Left atrium: The atrium was mildly dilated. - Right ventricle: The cavity size was normal. Wall thickness was   normal. Systolic function was normal. - Right atrium: The atrium was normal in size. - Tricuspid valve: There was trivial regurgitation. - Inferior vena cava: The vessel was normal in size. - Pericardium, extracardiac: There was no pericardial effusion.  Impressions:  - There is akinesis of the basal and mid inferolateral and basal   inferior walls. Overall LVEF is mildly impaired estimated at   40-45%.   RVEF is normal.   EKG:  EKG is not  ordered today. The ekg ordered today demonstrates   Recent Labs: 12/19/2016: ALT 18; B Natriuretic Peptide 756.2; Hemoglobin 16.3; Platelets 265 12/29/2016: BUN 44; Creatinine, Ser 1.53; Potassium 4.8; Sodium 138   Lipid Panel No results found for: CHOL, TRIG, HDL, CHOLHDL, VLDL, LDLCALC, LDLDIRECT   Wt Readings from Last 3 Encounters:  05/31/17 155 lb 12.8 oz (70.7 kg)  12/24/16 150 lb 6.4 oz (68.2 kg)  12/21/16 147 lb (66.7 kg)     Other studies Reviewed: Additional studies/ records that were reviewed today include: . Review of  the above records demonstrates:   Assessment and Plan:   1. CAD s/p CABG without angina: No chest pain suggestive of angina. He had CABG in 2013 and then had stenting of his left main into the diagonal branch in 2016. This branch was unprotected by the grafts. Will continue ASA, Plavix, statin, beta blocker.    2. Ischemic cardiomyopathy: Normal EF by echo in 2016. Continue beta blocker and Ace-inh.   3. Chronic systolic CHF: Weight is stable. Volume status ok. Continue Lasix as needed.     4. PAD: He has known PAD by non-invasive studies at [facility] in 2014 with  ABI 0.76 on the left and 0.46 on the right. We have discussed repeating non-invasive studies now but he wishes to wait. -Update today. He will call with changes in clinical status.   5. Atrial fibrillation/flutter: s/p ablation. He is in sinus today. No atrial fib post ablation. Only on ASA and Plavix.     6. Former tobacco abuse: He stopped smoking in 1998.   7. Mitral regurgitation: mild by echo December 2016. Will not need repeat echo at this time.   Current medicines are reviewed at length with the patient today.  The patient does not have concerns regarding medicines.  The following changes have been made:  no change  Labs/ tests ordered today include:   No orders of the defined types were placed in this encounter.    Disposition:   FU with me in 6 months   Signed, Lauree Chandler, MD 05/31/2017 12:22 PM    [facility] [address], [place], [place]  [address] Phone: [phone]; Fax: [phone]

## 2017-05-31 NOTE — Patient Instructions (Signed)

## 2017-08-16 DIAGNOSIS — Z961 Presence of intraocular lens: Secondary | ICD-10-CM | POA: Diagnosis not present

## 2017-08-16 DIAGNOSIS — H04123 Dry eye syndrome of bilateral lacrimal glands: Secondary | ICD-10-CM | POA: Diagnosis not present

## 2017-11-23 DIAGNOSIS — E785 Hyperlipidemia, unspecified: Secondary | ICD-10-CM | POA: Diagnosis not present

## 2017-11-23 DIAGNOSIS — I129 Hypertensive chronic kidney disease with stage 1 through stage 4 chronic kidney disease, or unspecified chronic kidney disease: Secondary | ICD-10-CM | POA: Diagnosis not present

## 2017-11-23 DIAGNOSIS — I739 Peripheral vascular disease, unspecified: Secondary | ICD-10-CM | POA: Diagnosis not present

## 2017-11-23 DIAGNOSIS — I480 Paroxysmal atrial fibrillation: Secondary | ICD-10-CM | POA: Diagnosis not present

## 2017-11-23 DIAGNOSIS — I2510 Atherosclerotic heart disease of native coronary artery without angina pectoris: Secondary | ICD-10-CM | POA: Diagnosis not present

## 2017-11-23 DIAGNOSIS — N183 Chronic kidney disease, stage 3 (moderate): Secondary | ICD-10-CM | POA: Diagnosis not present

## 2017-11-23 DIAGNOSIS — I255 Ischemic cardiomyopathy: Secondary | ICD-10-CM | POA: Diagnosis not present

## 2017-11-23 DIAGNOSIS — I700 Atherosclerosis of aorta: Secondary | ICD-10-CM | POA: Diagnosis not present

## 2017-11-23 DIAGNOSIS — I5020 Unspecified systolic (congestive) heart failure: Secondary | ICD-10-CM | POA: Diagnosis not present

## 2017-11-26 ENCOUNTER — Encounter: Payer: Self-pay | Admitting: Cardiovascular Disease

## 2017-12-06 ENCOUNTER — Ambulatory Visit (INDEPENDENT_AMBULATORY_CARE_PROVIDER_SITE_OTHER): Payer: Medicare Other | Admitting: Cardiovascular Disease

## 2017-12-06 ENCOUNTER — Encounter: Payer: Self-pay | Admitting: Cardiovascular Disease

## 2017-12-06 VITALS — BP 144/70 | HR 62 | Ht 68.0 in | Wt 158.8 lb

## 2017-12-06 DIAGNOSIS — I255 Ischemic cardiomyopathy: Secondary | ICD-10-CM

## 2017-12-06 DIAGNOSIS — I5022 Chronic systolic (congestive) heart failure: Secondary | ICD-10-CM | POA: Diagnosis not present

## 2017-12-06 DIAGNOSIS — I480 Paroxysmal atrial fibrillation: Secondary | ICD-10-CM | POA: Diagnosis not present

## 2017-12-06 DIAGNOSIS — I739 Peripheral vascular disease, unspecified: Secondary | ICD-10-CM | POA: Diagnosis not present

## 2017-12-06 DIAGNOSIS — I340 Nonrheumatic mitral (valve) insufficiency: Secondary | ICD-10-CM | POA: Diagnosis not present

## 2017-12-06 DIAGNOSIS — I2510 Atherosclerotic heart disease of native coronary artery without angina pectoris: Secondary | ICD-10-CM

## 2017-12-06 NOTE — Progress Notes (Signed)
Chief Complaint  Patient presents with  . Coronary Artery Disease   History of Present Illness: 84 yo male with history of CAD s/p 4V CABG in 2013, atrial flutter/fib s/p ablation 2013, ischemic cardiomyopathy, chronic systolic CHF, COPD who is here today for cardiac follow up. I met him as a new patient to establish cardiology care on 10/02/16. He had been followed in [place] by Dr. Rozetta Nunnery but moved to [facility] at the end of 2017. Cardiac history includes 4V CABG in November 2013 (LIMA to LAD, SVG to OM, SVG to PDA, SVG to Diagonal) and stenting of the left main artery in 2016 (DES placed from left main into the diagonal not protected by LIMA graft). He had an atrial flutter ablation in 2013. He had an echo in 2016 with normal LVEF and mild MR. He is also known to have PAD. He was told he had blockages in his legs and was seen by vascular surgery but he says they said there was nothing to worry about. Former smoker (50 pack years, stopped smoking in 1998). He was admitted to [facility] April 2018 with dyspnea and was felt to be volume overloaded. He was diuresed with IV Lasix and had rapid improvement in his dyspnea. Echo 12/21/16 with LVEF=40-45% with mild MR. He was discharged on oral Lasix.   He is here today for follow up. The patient denies any chest pain, dyspnea, palpitations, lower extremity edema, orthopnea, PND, dizziness, near syncope or syncope.   Primary Care Physician: Harlan Stains, MD  Past Medical History:  Diagnosis Date  . Atrial fibrillation ([place])   . CAD (coronary artery disease)    s/p CABG  . COPD (chronic obstructive pulmonary disease) ([place])   . HTN (hypertension)   . Ischemic cardiomyopathy   . Mitral regurgitation   . Systolic CHF [facility])     Past Surgical History:  Procedure Laterality Date  . CORONARY ARTERY BYPASS GRAFT     4Vessel    Current Outpatient Medications  Medication Sig Dispense Refill  . aspirin EC 81 MG tablet Take 81 mg  by mouth daily.    . carvedilol (COREG) 6.25 MG tablet Take 6.25 mg by mouth 2 (two) times daily.    . clopidogrel (PLAVIX) 75 MG tablet Take 75 mg by mouth daily.    . furosemide (LASIX) 20 MG tablet Take 20 mg by mouth daily as needed.    Marland Kitchen lisinopril (PRINIVIL,ZESTRIL) 5 MG tablet Take 1 tablet (5 mg total) by mouth daily. 30 tablet 0  . NON FORMULARY at bedtime. CPAP    . simvastatin (ZOCOR) 20 MG tablet Take 20 mg by mouth daily.     No current facility-administered medications for this visit.     No known drug allergies  Social History   Socioeconomic History  . Marital status: Married    Spouse name: Not on file  . Number of children: 3  . Years of education: Not on file  . Highest education level: Not on file  Occupational History  . Occupation: Control and instrumentation engineer for [employer]  . Financial resource strain: Not on file  . Food insecurity:    Worry: Not on file    Inability: Not on file  . Transportation needs:    Medical: Not on file    Non-medical: Not on file  Tobacco Use  . Smoking status: Former Smoker    Packs/day: 1.00    Years: 50.00    Pack years:  50.00    Types: Cigarettes    Last attempt to quit: 10/02/1996    Years since quitting: 21.1  . Smokeless tobacco: Never Used  Substance and Sexual Activity  . Alcohol use: Yes    Alcohol/week: 4.2 oz    Types: 7 Glasses of wine per week  . Drug use: No  . Sexual activity: Not on file  Lifestyle  . Physical activity:    Days per week: Not on file    Minutes per session: Not on file  . Stress: Not on file  Relationships  . Social connections:    Talks on phone: Not on file    Gets together: Not on file    Attends religious service: Not on file    Active member of club or organization: Not on file    Attends meetings of clubs or organizations: Not on file    Relationship status: Not on file  . Intimate partner violence:    Fear of current or ex partner: Not on file    Emotionally abused:  Not on file    Physically abused: Not on file    Forced sexual activity: Not on file  Other Topics Concern  . Not on file  Social History Narrative  . Not on file    Family History  Problem Relation Age of Onset  . Cancer Mother   . Heart attack Father 41    Review of Systems:  As stated in the HPI and otherwise negative.   BP (!) 144/70   Pulse 62   Ht '5\' 8"'  (1.727 m)   Wt 158 lb 12.8 oz (72 kg)   SpO2 98%   BMI 24.15 kg/m   Physical Examination:  General: Well developed, well nourished, NAD  HEENT: OP clear, mucus membranes moist  SKIN: warm, dry. No rashes. Neuro: No focal deficits  Musculoskeletal: Muscle strength 5/5 all ext  Psychiatric: Mood and affect normal  Neck: No JVD, no carotid bruits, no thyromegaly, no lymphadenopathy.  Lungs:Clear bilaterally, no wheezes, rhonci, crackles Cardiovascular: Regular rate and rhythm. No murmurs, gallops or rubs. Abdomen:Soft. Bowel sounds present. Non-tender.  Extremities: No lower extremity edema. Pulses are 2 + in the bilateral DP/PT.  Echo 12/21/16: Left ventricle: The cavity size was normal. There was mild   concentric hypertrophy. Systolic function was mildly to   moderately reduced. The estimated ejection fraction was in the   range of 40% to 45%. Wall motion was normal; there were no   regional wall motion abnormalities. Doppler parameters are   consistent with abnormal left ventricular relaxation (grade 1   diastolic dysfunction). There was no evidence of elevated   ventricular filling pressure by Doppler parameters. - Aortic valve: Trileaflet; normal thickness leaflets. - Aortic root: The aortic root was normal in size. - Ascending aorta: The ascending aorta was normal in size. - Mitral valve: There was mild regurgitation directed centrally. - Left atrium: The atrium was mildly dilated. - Right ventricle: The cavity size was normal. Wall thickness was   normal. Systolic function was normal. - Right atrium: The  atrium was normal in size. - Tricuspid valve: There was trivial regurgitation. - Inferior vena cava: The vessel was normal in size. - Pericardium, extracardiac: There was no pericardial effusion.  Impressions:  - There is akinesis of the basal and mid inferolateral and basal   inferior walls. Overall LVEF is mildly impaired estimated at   40-45%.   RVEF is normal.   EKG:  EKG is  ordered today. The ekg ordered today demonstrates Sinus, rate 62 bpm. 1st AV block  Recent Labs: 12/19/2016: ALT 18; B Natriuretic Peptide 756.2; Hemoglobin 16.3; Platelets 265 12/29/2016: BUN 44; Creatinine, Ser 1.53; Potassium 4.8; Sodium 138   Lipid Panel No results found for: CHOL, TRIG, HDL, CHOLHDL, VLDL, LDLCALC, LDLDIRECT   Wt Readings from Last 3 Encounters:  12/06/17 158 lb 12.8 oz (72 kg)  05/31/17 155 lb 12.8 oz (70.7 kg)  12/24/16 150 lb 6.4 oz (68.2 kg)     Other studies Reviewed: Additional studies/ records that were reviewed today include: . Review of the above records demonstrates:   Assessment and Plan:   1. CAD s/p CABG without angina: He had CABG in 2013 and then had stenting of his left main into the diagonal branch in 2016. This branch was unprotected by the grafts. He is not having chest pain. Will continue ASA, Plavix, statin and beta blocker.     2. Ischemic cardiomyopathy: Mild LV systolic dysfunction by echo in April 2018 with LVEF=45%. Will continue beta blocker and Ace inh.    3. Chronic systolic CHF:  NO evidence of volume overload. Continue Lasix.      4. PAD: He has known PAD by non-invasive studies at [facility] in 2014 with ABI 0.76 on the left and 0.46 on the right. We have discussed repeating non-invasive studies now but he wishes to wait. He will call if he has pain in his legs.    5. Atrial fibrillation/flutter: s/p ablation. Sinus today. He has not been on long term anticoagulation since he has has no recurrence of his AF following ablation.      6. Former  tobacco abuse: He stopped smoking in 1998.   7. Mitral regurgitation: Mild by echo April 2018.   Current medicines are reviewed at length with the patient today.  The patient does not have concerns regarding medicines.  The following changes have been made:  no change  Labs/ tests ordered today include:   Orders Placed This Encounter  Procedures  . EKG 12-Lead     Disposition:   FU with me in 6 months   Signed, Lauree Chandler, MD 12/06/2017 12:03 PM    [address] [place], [place],   [address] Phone: [phone]; Fax: [phone]

## 2017-12-06 NOTE — Patient Instructions (Signed)
Medication Instructions:  Your physician recommends that you continue on your current medications as directed. Please refer to the Current Medication list given to you today.   Labwork: none  Testing/Procedures: none  Follow-Up: Your physician recommends that you schedule a follow-up appointment in: 6 months. Scheduled for 06/08/18 at 10:20    Any Other Special Instructions Will Be Listed Below (If Applicable).     If you need a refill on your cardiac medications before your next appointment, please call your pharmacy.

## 2017-12-22 DIAGNOSIS — M545 Low back pain: Secondary | ICD-10-CM | POA: Diagnosis not present

## 2017-12-22 DIAGNOSIS — S8265XA Nondisplaced fracture of lateral malleolus of left fibula, initial encounter for closed fracture: Secondary | ICD-10-CM | POA: Diagnosis not present

## 2017-12-24 DIAGNOSIS — M25572 Pain in left ankle and joints of left foot: Secondary | ICD-10-CM | POA: Diagnosis not present

## 2018-01-05 DIAGNOSIS — M25572 Pain in left ankle and joints of left foot: Secondary | ICD-10-CM | POA: Diagnosis not present

## 2018-01-26 DIAGNOSIS — M25572 Pain in left ankle and joints of left foot: Secondary | ICD-10-CM | POA: Diagnosis not present

## 2018-02-25 DIAGNOSIS — M25572 Pain in left ankle and joints of left foot: Secondary | ICD-10-CM | POA: Diagnosis not present

## 2018-03-25 IMAGING — CT CT ANGIO CHEST
2 of 8 series · 18 of 36 positions shown · IV contrast (OMNI)
Comparison: Chest radiograph from earlier today.

CLINICAL DATA: Acute onset dyspnea.

EXAM:
CT ANGIOGRAPHY CHEST WITH CONTRAST
TECHNIQUE: Multidetector CT imaging of the chest was performed using the
standard protocol during bolus administration of intravenous
contrast. Multiplanar CT image reconstructions and MIPs were
obtained to evaluate the vascular anatomy.
CONTRAST:  100 cc Isovue 370 IV.

[Series 6: thins · axial · 0.72mm/px · z∈[+996,+1304]mm · 17 of 346 slices shown]
[im 19/346  lung]
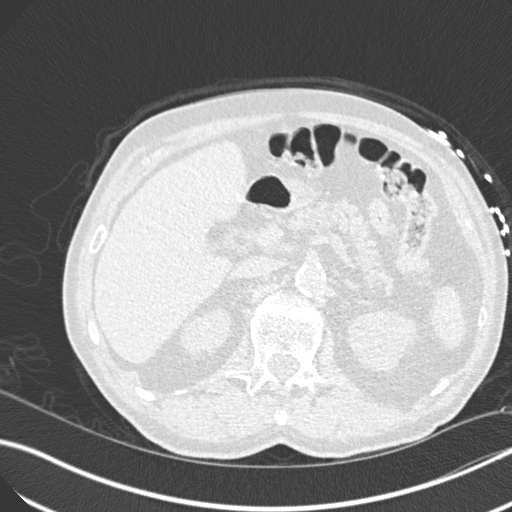
[im 37/346  mediastinal]
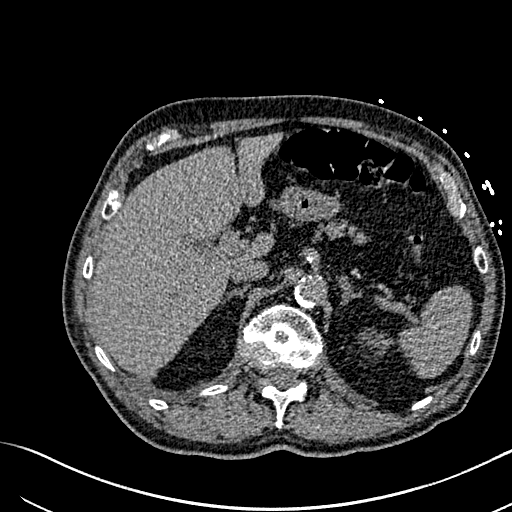
[im 55/346  lung]
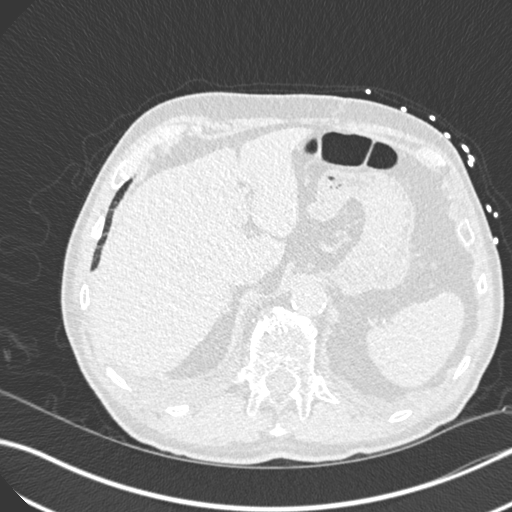
[im 73/346  mediastinal]
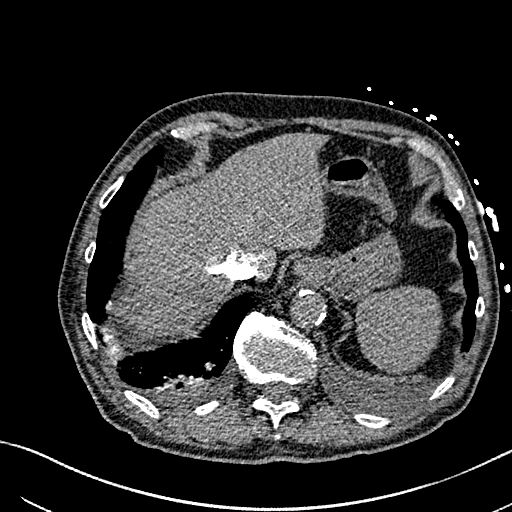
[im 91/346  lung]
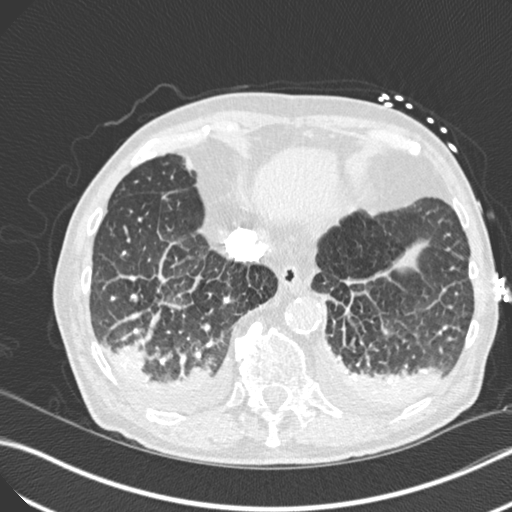
[im 109/346  mediastinal]
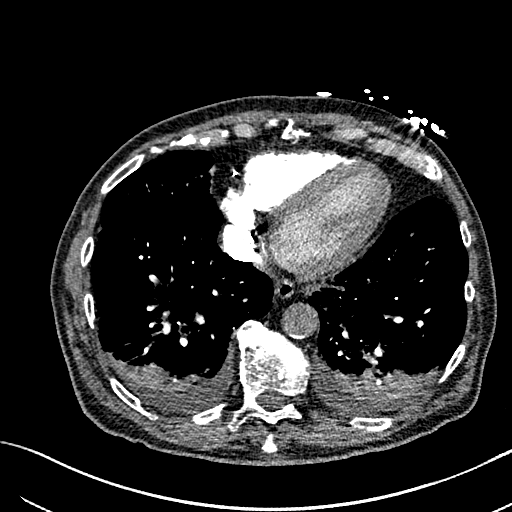
[im 128/346  lung]
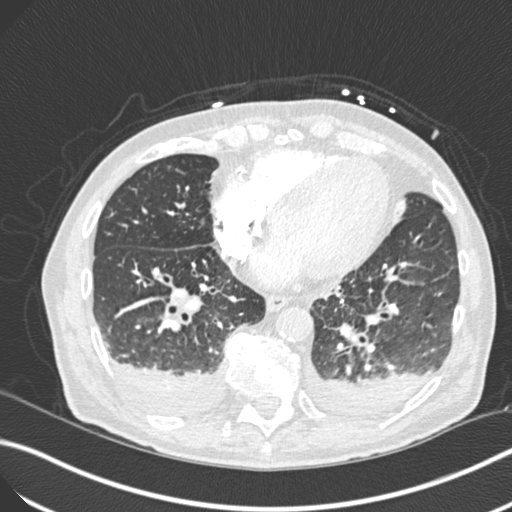
[im 146/346  mediastinal]
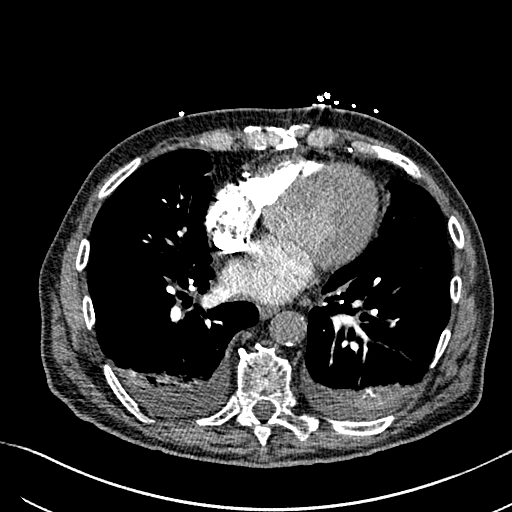
[im 182/346  lung]
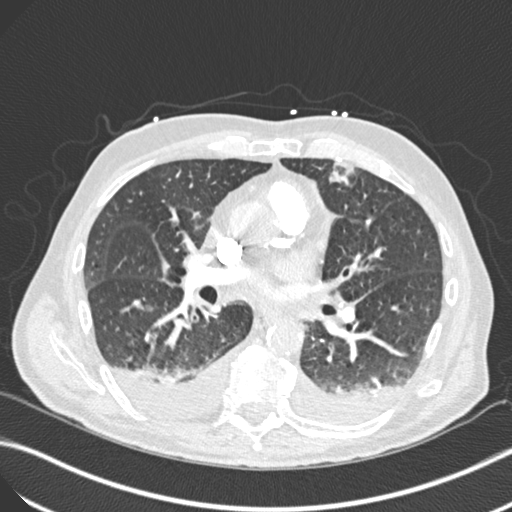
[im 200/346  mediastinal]
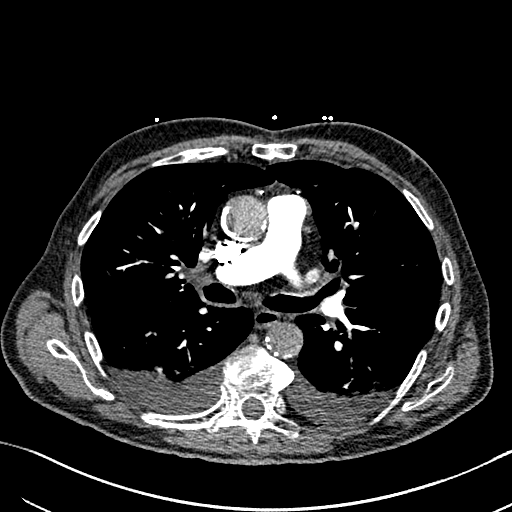
[im 218/346  lung]
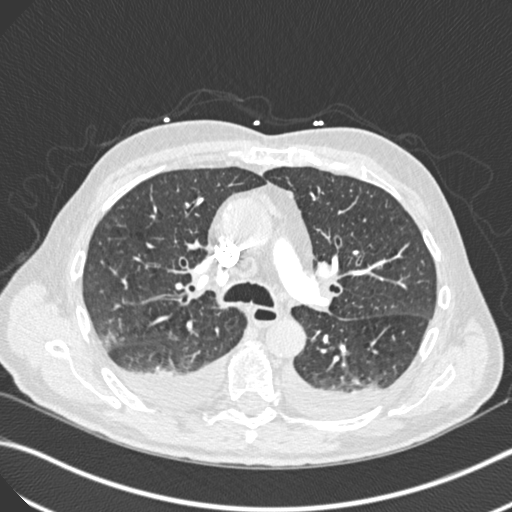
[im 237/346  mediastinal]
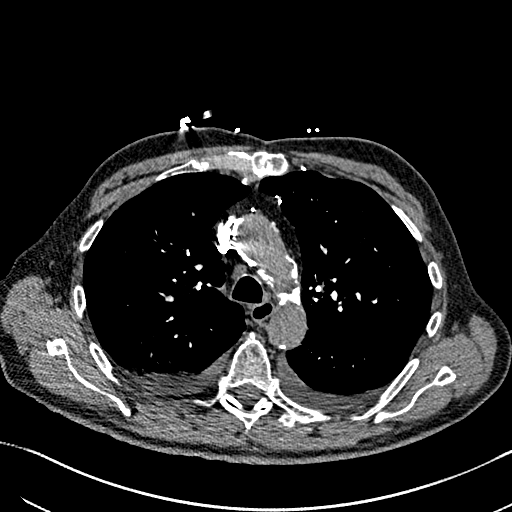
[im 255/346  lung]
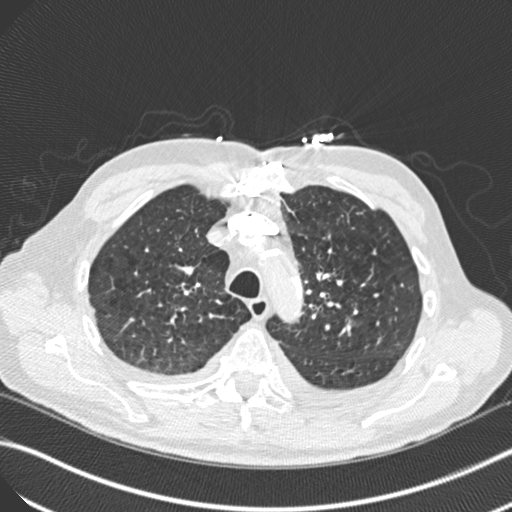
[im 273/346  mediastinal]
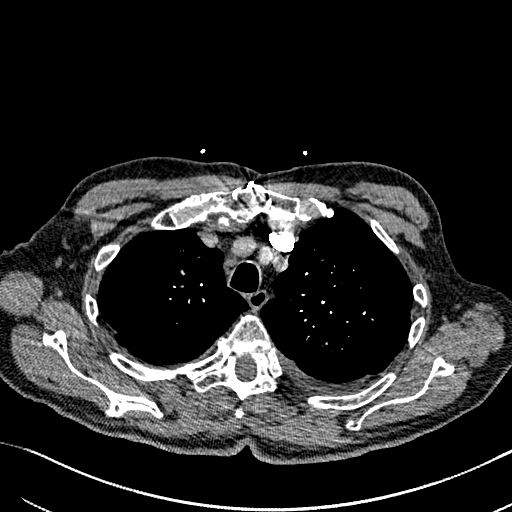
[im 291/346  lung]
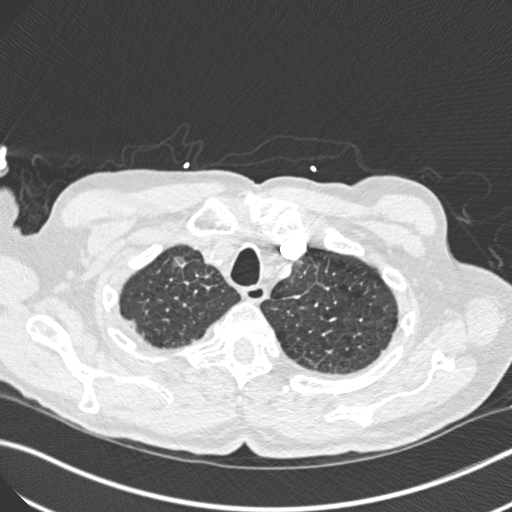
[im 309/346  mediastinal]
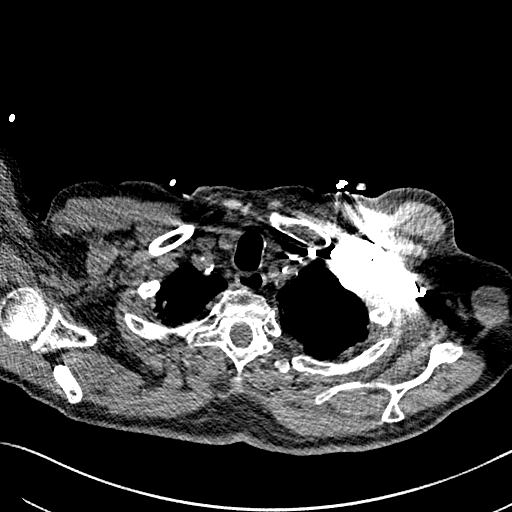
[im 327/346  lung]
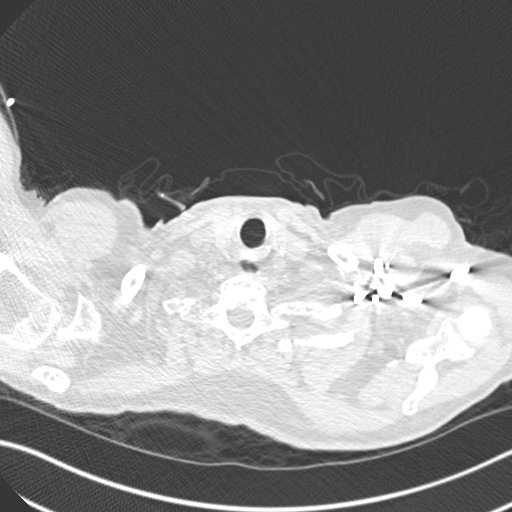

[Series 8: coronal mpr · coronal · 0.68mm/px · 1 of 132 slices shown]
[im 66/132  mediastinal]
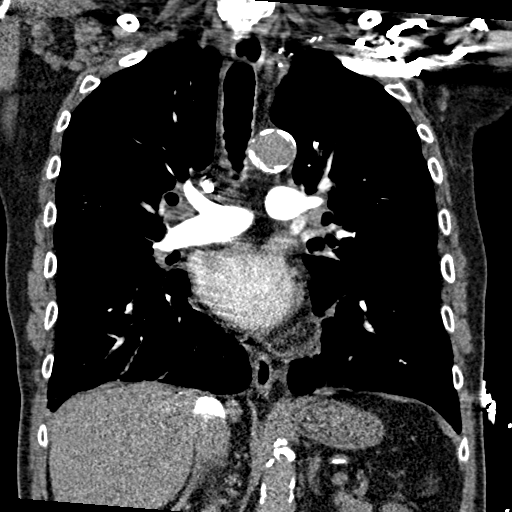

[18 of 36 positions shown; findings below may reference images not displayed]

FINDINGS: Cardiovascular: The study is high quality for the evaluation of
pulmonary embolism. There are no filling defects in the central,
lobar, segmental or subsegmental pulmonary artery branches to
suggest acute pulmonary embolism. Atherosclerotic nonaneurysmal
thoracic aorta. Normal caliber pulmonary arteries. Normal heart
size. No significant pericardial fluid/thickening. Left main, left
anterior descending, left circumflex and right coronary
atherosclerosis status post CABG.

Mediastinum/Nodes: No discrete thyroid nodules. Small fluid level in
the mid thoracic esophagus suggests esophageal dysmotility and/ or
gastroesophageal reflux. No pathologically enlarged axillary,
mediastinal or hilar lymph nodes.

Lungs/Pleura: No pneumothorax. Small dependent bilateral pleural
effusions. Subsolid 1.1 cm basilar right upper lobe pulmonary nodule
(series 7/ image 52). Interlobular septal thickening throughout both
lungs. Mild-to-moderate centrilobular emphysema with diffuse
bronchial wall thickening. Patchy consolidation with volume loss in
the dependent lower lobes bilaterally.

Upper abdomen: Subcentimeter hypodense lateral segment left liver
lobe lesions, too small to characterize, which require no further
follow up unless the patient has risk factors for liver malignancy.
Cholelithiasis.

Musculoskeletal: No aggressive appearing focal osseous lesions. Mild
thoracic spondylosis. Sternotomy wires appear intact.

Review of the MIP images confirms the above findings.
IMPRESSION: 1. No pulmonary embolism.
2. Small dependent bilateral pleural effusions. Interlobular septal
thickening in both lungs. These findings suggest acute congestive
heart failure despite the normal heart size.
3. Mild-to-moderate centrilobular emphysema with diffuse bronchial
wall thickening, suggesting COPD.
4. Sub solid 1.1 cm right upper lobe pulmonary nodule. Follow-up
non-contrast CT recommended at 3-6 months to confirm persistence. If
unchanged, and solid component remains <6 mm, annual CT is
recommended until 5 years of stability has been established. If
persistent these nodules should be considered highly suspicious if
the solid component of the nodule is 6 mm or greater in size and
enlarging. This recommendation follows the consensus statement:
Guidelines for Management of Incidental Pulmonary Nodules Detected
[DATE].
5. Aortic atherosclerosis. Left main and 3 vessel coronary
atherosclerosis status post CABG.
6. Cholelithiasis.

## 2018-03-25 IMAGING — CR DG CHEST 1V PORT
2 series · 2 of 2 positions shown · non-contrast
Comparison: None.

CLINICAL DATA: Shortness of breath

EXAM:
PORTABLE CHEST 1 VIEW

[AP (1 of 2)]
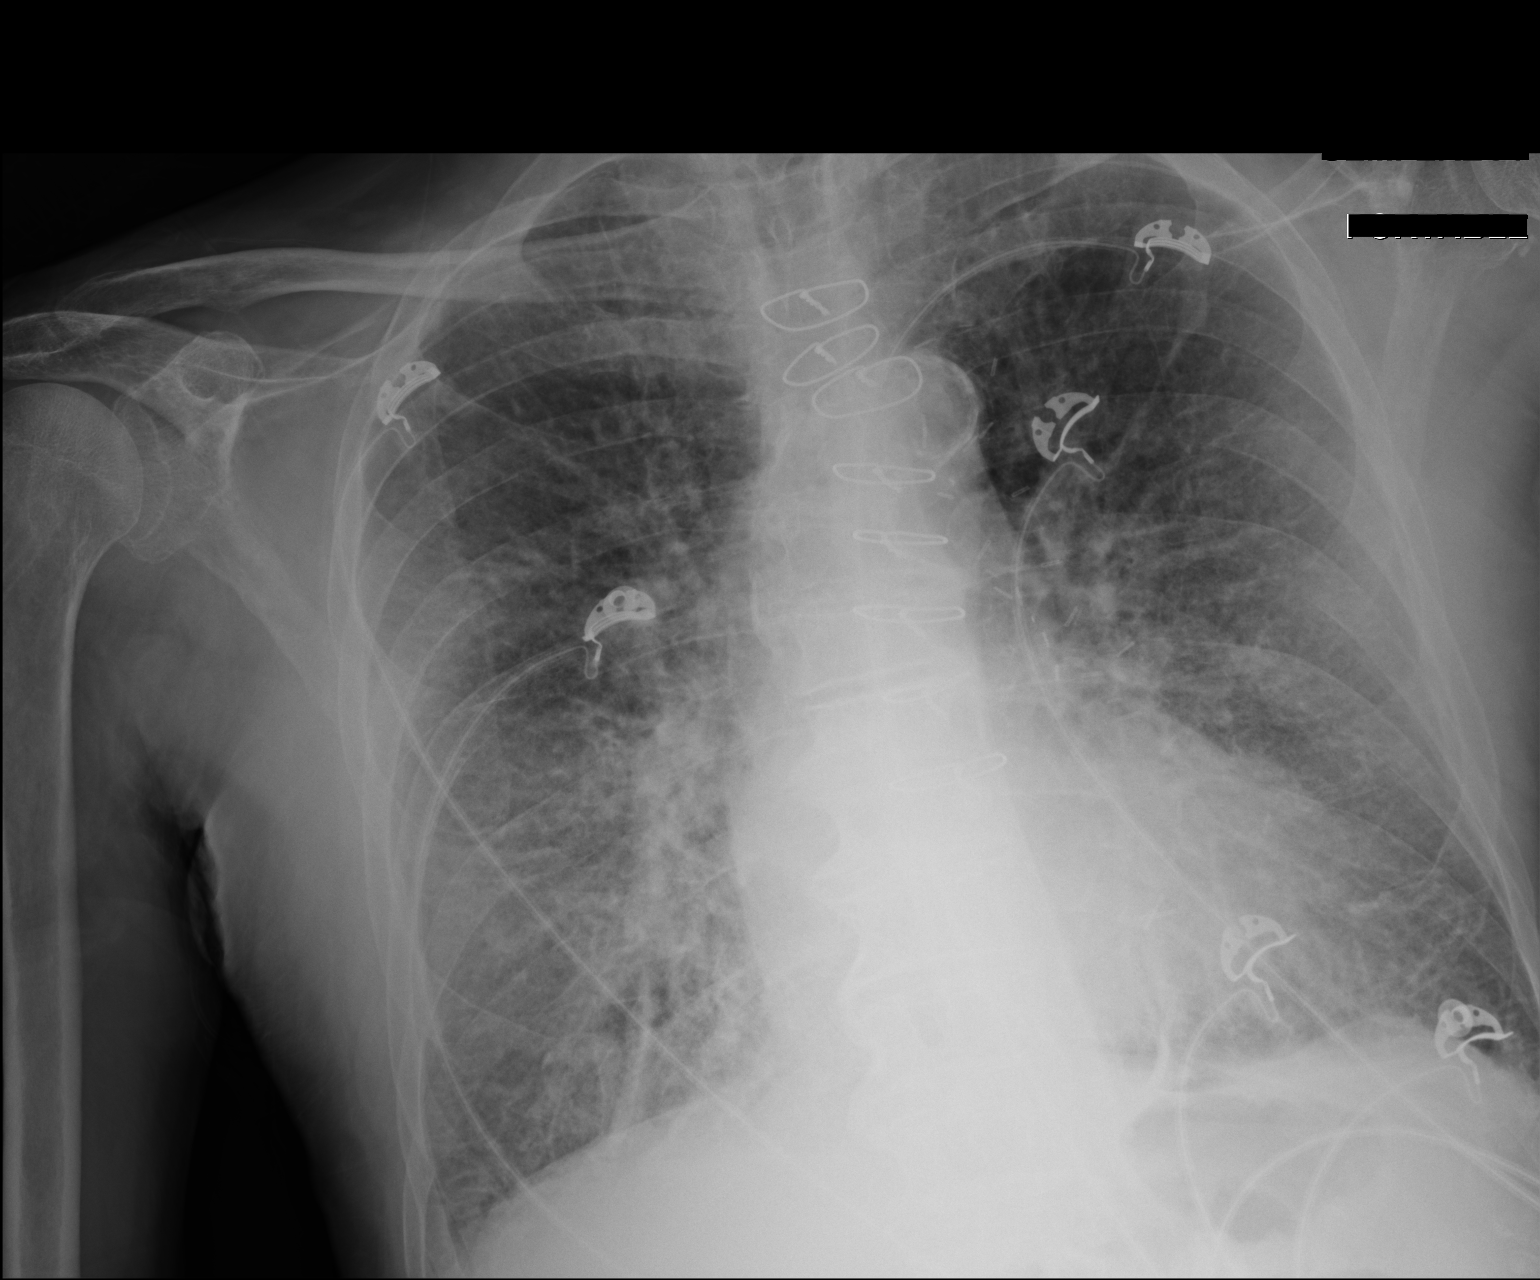

[AP (2 of 2)]
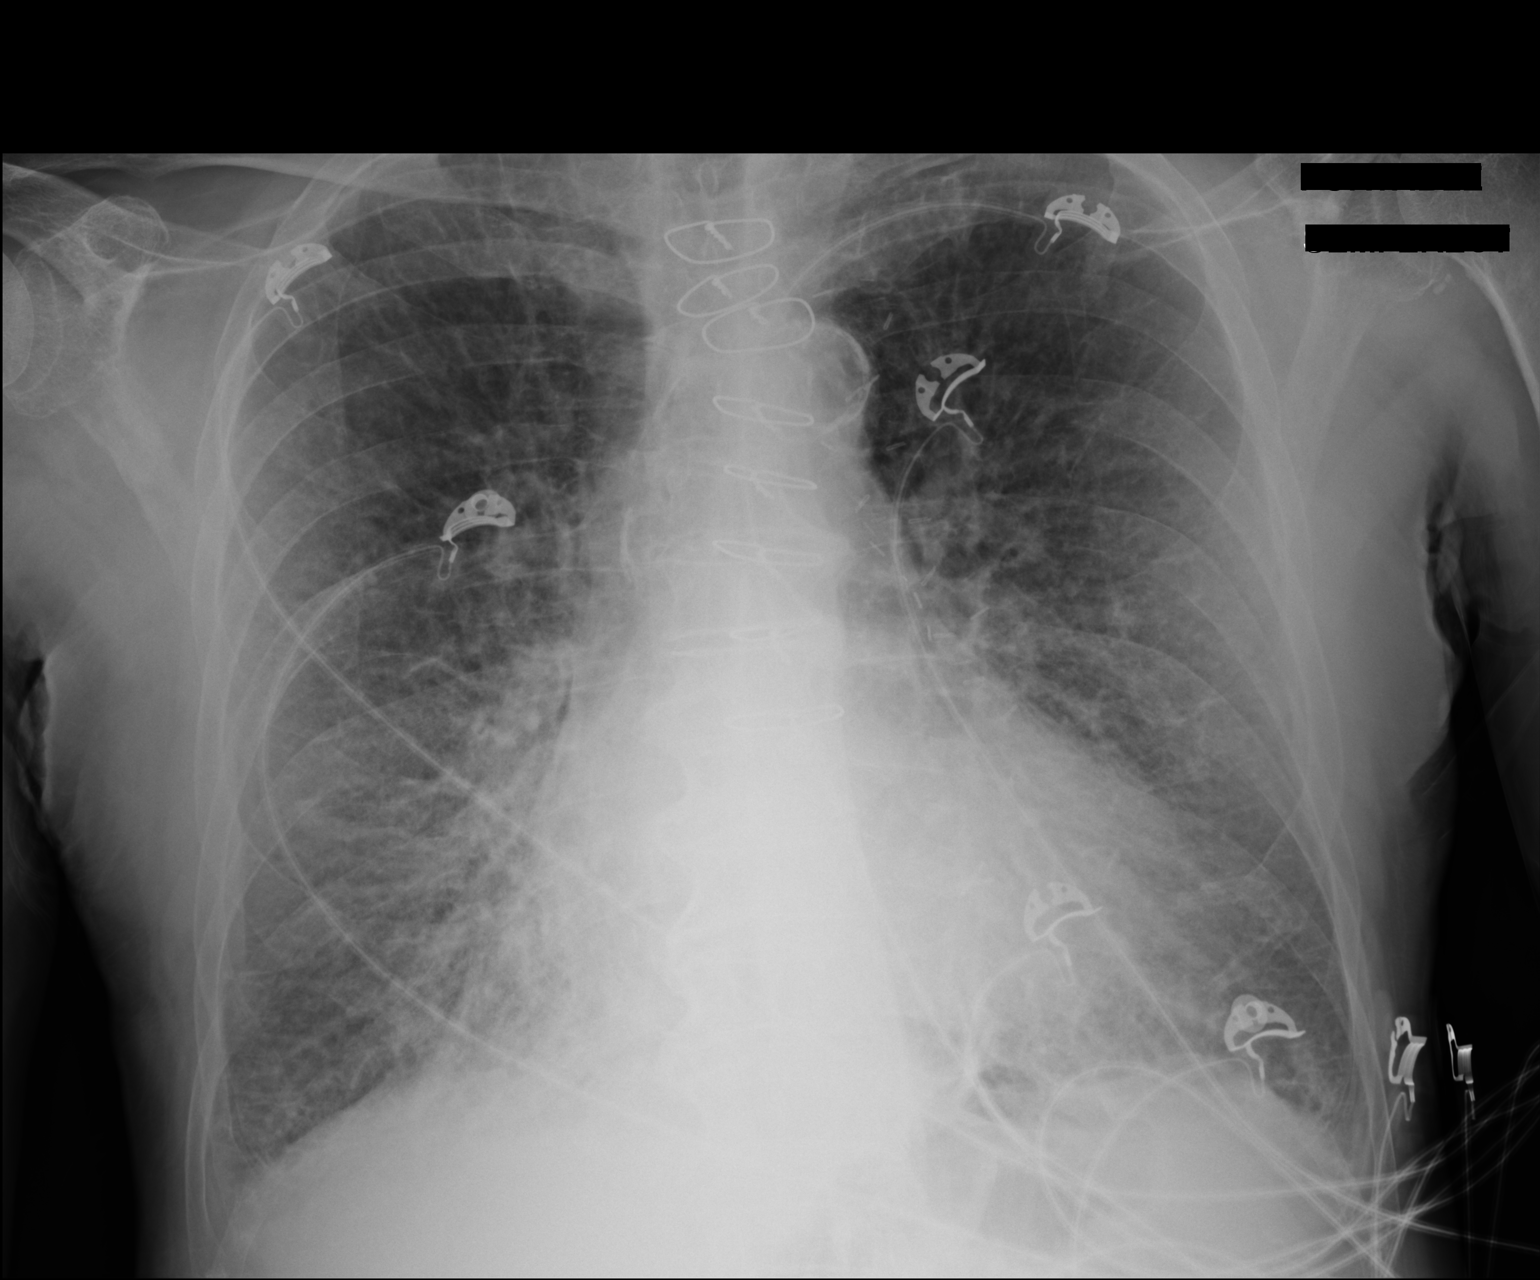

[2 of 2 positions shown; findings below may reference images not displayed]

FINDINGS: Mild cardiomegaly. Median sternotomy wires appear intact.
Atherosclerotic changes noted at the aortic arch.

There is central pulmonary vascular congestion and diffuse bilateral
interstitial thickening which is presumed to be interstitial edema.
No pleural effusion or pneumothorax seen. No acute or suspicious
osseous finding.
IMPRESSION: 1. Cardiomegaly with central pulmonary vascular congestion and
diffuse bilateral interstitial thickening, presumed interstitial
edema related to CHF. Without prior studies, I cannot exclude
underlying chronic interstitial lung disease.

2.  Aortic atherosclerosis.

## 2018-04-05 ENCOUNTER — Other Ambulatory Visit: Payer: Self-pay | Admitting: Family Medicine

## 2018-04-05 DIAGNOSIS — R911 Solitary pulmonary nodule: Secondary | ICD-10-CM

## 2018-04-08 DIAGNOSIS — M25572 Pain in left ankle and joints of left foot: Secondary | ICD-10-CM | POA: Diagnosis not present

## 2018-04-20 ENCOUNTER — Ambulatory Visit
Admission: RE | Admit: 2018-04-20 | Discharge: 2018-04-20 | Disposition: A | Discharge status: Home / Self Care | Payer: Medicare Other | Source: Ambulatory Visit | Attending: Family Medicine | Admitting: Family Medicine

## 2018-04-20 DIAGNOSIS — R911 Solitary pulmonary nodule: Secondary | ICD-10-CM | POA: Diagnosis not present

## 2018-06-08 ENCOUNTER — Encounter: Payer: Self-pay | Admitting: Cardiovascular Disease

## 2018-06-08 ENCOUNTER — Ambulatory Visit (INDEPENDENT_AMBULATORY_CARE_PROVIDER_SITE_OTHER): Payer: Medicare Other | Admitting: Cardiovascular Disease

## 2018-06-08 VITALS — BP 146/60 | HR 71 | Ht 68.0 in | Wt 159.4 lb

## 2018-06-08 DIAGNOSIS — I255 Ischemic cardiomyopathy: Secondary | ICD-10-CM

## 2018-06-08 DIAGNOSIS — F17201 Nicotine dependence, unspecified, in remission: Secondary | ICD-10-CM | POA: Diagnosis not present

## 2018-06-08 DIAGNOSIS — I340 Nonrheumatic mitral (valve) insufficiency: Secondary | ICD-10-CM

## 2018-06-08 DIAGNOSIS — I739 Peripheral vascular disease, unspecified: Secondary | ICD-10-CM

## 2018-06-08 DIAGNOSIS — I5022 Chronic systolic (congestive) heart failure: Secondary | ICD-10-CM | POA: Diagnosis not present

## 2018-06-08 DIAGNOSIS — I10 Essential (primary) hypertension: Secondary | ICD-10-CM

## 2018-06-08 DIAGNOSIS — I480 Paroxysmal atrial fibrillation: Secondary | ICD-10-CM | POA: Diagnosis not present

## 2018-06-08 DIAGNOSIS — I2510 Atherosclerotic heart disease of native coronary artery without angina pectoris: Secondary | ICD-10-CM

## 2018-06-08 NOTE — Patient Instructions (Signed)

## 2018-06-08 NOTE — Progress Notes (Signed)
Chief Complaint  Patient presents with  . Follow-up    CAD   History of Present Illness: 84 yo male with history of CAD s/p 4V CABG in 2013, atrial flutter/fib s/p ablation 2013, ischemic cardiomyopathy, chronic systolic CHF, COPD who is here today for cardiac follow up. I met him as a new patient to establish cardiology care on 10/02/16. He had been followed in [place] by Dr. Rozetta Nunnery but moved to [facility] at the end of 2017. Cardiac history includes 4V CABG in November 2013 (LIMA to LAD, SVG to OM, SVG to PDA, SVG to Diagonal) and stenting of the left main artery in 2016 (DES placed from left main into the diagonal not protected by LIMA graft). He had an atrial flutter ablation in 2013. He had an echo in 2016 with normal LVEF and mild MR. He is also known to have PAD. He was told he had blockages in his legs and was seen by vascular surgery but he says they said there was nothing to worry about. Former smoker (50 pack years, stopped smoking in 1998). He was admitted to [facility] April 2018 with dyspnea and was felt to be volume overloaded. He was diuresed with IV Lasix and had rapid improvement in his dyspnea. Echo 12/21/16 with LVEF=40-45% with mild MR.  He is here today for follow up. The patient denies any chest pain, dyspnea, palpitations, lower extremity edema, orthopnea, PND, dizziness, near syncope or syncope.   Primary Care Physician: Harlan Stains, MD  Past Medical History:  Diagnosis Date  . Atrial fibrillation ([place])   . CAD (coronary artery disease)    s/p CABG  . COPD (chronic obstructive pulmonary disease) ([place])   . HTN (hypertension)   . Ischemic cardiomyopathy   . Mitral regurgitation   . Systolic CHF [facility])     Past Surgical History:  Procedure Laterality Date  . CORONARY ARTERY BYPASS GRAFT     4Vessel    Current Outpatient Medications  Medication Sig Dispense Refill  . aspirin EC 81 MG tablet Take 81 mg by mouth daily.    . carvedilol (COREG)  6.25 MG tablet Take 6.25 mg by mouth 2 (two) times daily.    . clopidogrel (PLAVIX) 75 MG tablet Take 75 mg by mouth daily.    . furosemide (LASIX) 20 MG tablet Take 20 mg by mouth daily as needed.    Marland Kitchen lisinopril (PRINIVIL,ZESTRIL) 5 MG tablet Take 1 tablet (5 mg total) by mouth daily. 30 tablet 0  . NON FORMULARY at bedtime. CPAP    . simvastatin (ZOCOR) 20 MG tablet Take 20 mg by mouth daily.     No current facility-administered medications for this visit.     No known drug allergies  Social History   Socioeconomic History  . Marital status: Married    Spouse name: Not on file  . Number of children: 3  . Years of education: Not on file  . Highest education level: Not on file  Occupational History  . Occupation: Control and instrumentation engineer for [employer]  . Financial resource strain: Not on file  . Food insecurity:    Worry: Not on file    Inability: Not on file  . Transportation needs:    Medical: Not on file    Non-medical: Not on file  Tobacco Use  . Smoking status: Former Smoker    Packs/day: 1.00    Years: 50.00    Pack years: 50.00    Types:  Cigarettes    Last attempt to quit: 10/02/1996    Years since quitting: 21.6  . Smokeless tobacco: Never Used  Substance and Sexual Activity  . Alcohol use: Yes    Alcohol/week: 7.0 standard drinks    Types: 7 Glasses of wine per week  . Drug use: No  . Sexual activity: Not on file  Lifestyle  . Physical activity:    Days per week: Not on file    Minutes per session: Not on file  . Stress: Not on file  Relationships  . Social connections:    Talks on phone: Not on file    Gets together: Not on file    Attends religious service: Not on file    Active member of club or organization: Not on file    Attends meetings of clubs or organizations: Not on file    Relationship status: Not on file  . Intimate partner violence:    Fear of current or ex partner: Not on file    Emotionally abused: Not on file    Physically  abused: Not on file    Forced sexual activity: Not on file  Other Topics Concern  . Not on file  Social History Narrative  . Not on file    Family History  Problem Relation Age of Onset  . Cancer Mother   . Heart attack Father 59    Review of Systems:  As stated in the HPI and otherwise negative.   BP (!) 146/60   Pulse 71   Ht 5' 8" (1.727 m)   Wt 159 lb 6.4 oz (72.3 kg)   SpO2 96%   BMI 24.24 kg/m   Physical Examination:  General: Well developed, well nourished, NAD  HEENT: OP clear, mucus membranes moist  SKIN: warm, dry. No rashes. Neuro: No focal deficits  Musculoskeletal: Muscle strength 5/5 all ext  Psychiatric: Mood and affect normal  Neck: No JVD, no carotid bruits, no thyromegaly, no lymphadenopathy.  Lungs:Clear bilaterally, no wheezes, rhonci, crackles Cardiovascular: Regular rate and rhythm. No murmurs, gallops or rubs. Abdomen:Soft. Bowel sounds present. Non-tender.  Extremities: No lower extremity edema. Pulses are 2 + in the bilateral DP/PT.  Echo 12/21/16: Left ventricle: The cavity size was normal. There was mild   concentric hypertrophy. Systolic function was mildly to   moderately reduced. The estimated ejection fraction was in the   range of 40% to 45%. Wall motion was normal; there were no   regional wall motion abnormalities. Doppler parameters are   consistent with abnormal left ventricular relaxation (grade 1   diastolic dysfunction). There was no evidence of elevated   ventricular filling pressure by Doppler parameters. - Aortic valve: Trileaflet; normal thickness leaflets. - Aortic root: The aortic root was normal in size. - Ascending aorta: The ascending aorta was normal in size. - Mitral valve: There was mild regurgitation directed centrally. - Left atrium: The atrium was mildly dilated. - Right ventricle: The cavity size was normal. Wall thickness was   normal. Systolic function was normal. - Right atrium: The atrium was normal in  size. - Tricuspid valve: There was trivial regurgitation. - Inferior vena cava: The vessel was normal in size. - Pericardium, extracardiac: There was no pericardial effusion.  Impressions:  - There is akinesis of the basal and mid inferolateral and basal   inferior walls. Overall LVEF is mildly impaired estimated at   40-45%.   RVEF is normal.   EKG:  EKG is not ordered today. The  ekg ordered today demonstrates   Recent Labs: No results found for requested labs within last 8760 hours.   Lipid Panel No results found for: CHOL, TRIG, HDL, CHOLHDL, VLDL, LDLCALC, LDLDIRECT   Wt Readings from Last 3 Encounters:  06/08/18 159 lb 6.4 oz (72.3 kg)  12/06/17 158 lb 12.8 oz (72 kg)  05/31/17 155 lb 12.8 oz (70.7 kg)     Other studies Reviewed: Additional studies/ records that were reviewed today include: . Review of the above records demonstrates:   Assessment and Plan:   1. CAD s/p CABG without angina: He had CABG in 2013 and then had stenting of his left main into the diagonal branch in 2016. This branch was unprotected by the grafts. No chest pain. Will continue ASA, Plavix, statin and beta blocker.      2. Ischemic cardiomyopathy: Mild LV systolic dysfunction by echo in April 2018 with LVEF=45%. Continue beta blocker and Ace-inh.  3. Chronic systolic CHF:  Weight is stable. No volume overload. Continue Lasix.   4. PAD: He has known PAD by non-invasive studies at [facility] in 2014 with ABI 0.76 on the left and 0.46 on the right. We have discussed repeating non-invasive studies now but he wishes to wait. He will call if he has pain in his legs.    5. Atrial fibrillation/flutter: He is s/p ablation. He is in sinus today. He has not been on long term anticoagulation since he has has no recurrence of his AF following ablation.      6. Former tobacco abuse: He stopped smoking in 1998.   7. Mitral regurgitation: Mild by echo April 2018. Will follow  8. HTN: BP controlled at  home  Current medicines are reviewed at length with the patient today.  The patient does not have concerns regarding medicines.  The following changes have been made:  no change  Labs/ tests ordered today include:   No orders of the defined types were placed in this encounter.    Disposition:   FU with me in 6 months   Signed, Lauree Chandler, MD 06/08/2018 10:45 AM    [facility] [place], [place], [place]  [address] Phone: [phone]; Fax: [phone]

## 2018-06-30 DIAGNOSIS — Z1389 Encounter for screening for other disorder: Secondary | ICD-10-CM | POA: Diagnosis not present

## 2018-06-30 DIAGNOSIS — Z0000 Encounter for general adult medical examination without abnormal findings: Secondary | ICD-10-CM | POA: Diagnosis not present

## 2018-06-30 DIAGNOSIS — Z23 Encounter for immunization: Secondary | ICD-10-CM | POA: Diagnosis not present

## 2018-06-30 DIAGNOSIS — I129 Hypertensive chronic kidney disease with stage 1 through stage 4 chronic kidney disease, or unspecified chronic kidney disease: Secondary | ICD-10-CM | POA: Diagnosis not present

## 2018-06-30 DIAGNOSIS — I5020 Unspecified systolic (congestive) heart failure: Secondary | ICD-10-CM | POA: Diagnosis not present

## 2018-06-30 DIAGNOSIS — N183 Chronic kidney disease, stage 3 (moderate): Secondary | ICD-10-CM | POA: Diagnosis not present

## 2018-06-30 DIAGNOSIS — K4090 Unilateral inguinal hernia, without obstruction or gangrene, not specified as recurrent: Secondary | ICD-10-CM | POA: Diagnosis not present

## 2018-06-30 DIAGNOSIS — I739 Peripheral vascular disease, unspecified: Secondary | ICD-10-CM | POA: Diagnosis not present

## 2018-06-30 DIAGNOSIS — I255 Ischemic cardiomyopathy: Secondary | ICD-10-CM | POA: Diagnosis not present

## 2018-06-30 DIAGNOSIS — E785 Hyperlipidemia, unspecified: Secondary | ICD-10-CM | POA: Diagnosis not present

## 2018-11-03 DIAGNOSIS — R040 Epistaxis: Secondary | ICD-10-CM | POA: Diagnosis not present

## 2018-11-16 DIAGNOSIS — H0102B Squamous blepharitis left eye, upper and lower eyelids: Secondary | ICD-10-CM | POA: Diagnosis not present

## 2018-11-16 DIAGNOSIS — H17813 Minor opacity of cornea, bilateral: Secondary | ICD-10-CM | POA: Diagnosis not present

## 2018-11-16 DIAGNOSIS — Z961 Presence of intraocular lens: Secondary | ICD-10-CM | POA: Diagnosis not present

## 2018-11-16 DIAGNOSIS — H0102A Squamous blepharitis right eye, upper and lower eyelids: Secondary | ICD-10-CM | POA: Diagnosis not present

## 2018-12-30 DIAGNOSIS — I739 Peripheral vascular disease, unspecified: Secondary | ICD-10-CM | POA: Diagnosis not present

## 2018-12-30 DIAGNOSIS — I129 Hypertensive chronic kidney disease with stage 1 through stage 4 chronic kidney disease, or unspecified chronic kidney disease: Secondary | ICD-10-CM | POA: Diagnosis not present

## 2018-12-30 DIAGNOSIS — I255 Ischemic cardiomyopathy: Secondary | ICD-10-CM | POA: Diagnosis not present

## 2018-12-30 DIAGNOSIS — E785 Hyperlipidemia, unspecified: Secondary | ICD-10-CM | POA: Diagnosis not present

## 2018-12-30 DIAGNOSIS — I2510 Atherosclerotic heart disease of native coronary artery without angina pectoris: Secondary | ICD-10-CM | POA: Diagnosis not present

## 2018-12-30 DIAGNOSIS — N183 Chronic kidney disease, stage 3 (moderate): Secondary | ICD-10-CM | POA: Diagnosis not present

## 2018-12-30 DIAGNOSIS — I5020 Unspecified systolic (congestive) heart failure: Secondary | ICD-10-CM | POA: Diagnosis not present

## 2018-12-30 DIAGNOSIS — I700 Atherosclerosis of aorta: Secondary | ICD-10-CM | POA: Diagnosis not present

## 2019-01-04 ENCOUNTER — Telehealth (INDEPENDENT_AMBULATORY_CARE_PROVIDER_SITE_OTHER): Payer: Medicare Other | Admitting: Cardiovascular Disease

## 2019-01-04 ENCOUNTER — Telehealth: Payer: Self-pay | Admitting: Cardiovascular Disease

## 2019-01-04 ENCOUNTER — Other Ambulatory Visit: Payer: Self-pay

## 2019-01-04 ENCOUNTER — Encounter: Payer: Self-pay | Admitting: Cardiovascular Disease

## 2019-01-04 VITALS — BP 169/87 | HR 87 | Ht 68.0 in | Wt 153.0 lb

## 2019-01-04 DIAGNOSIS — I340 Nonrheumatic mitral (valve) insufficiency: Secondary | ICD-10-CM

## 2019-01-04 DIAGNOSIS — I480 Paroxysmal atrial fibrillation: Secondary | ICD-10-CM | POA: Diagnosis not present

## 2019-01-04 DIAGNOSIS — I2510 Atherosclerotic heart disease of native coronary artery without angina pectoris: Secondary | ICD-10-CM

## 2019-01-04 DIAGNOSIS — I5022 Chronic systolic (congestive) heart failure: Secondary | ICD-10-CM

## 2019-01-04 DIAGNOSIS — F17201 Nicotine dependence, unspecified, in remission: Secondary | ICD-10-CM

## 2019-01-04 DIAGNOSIS — I110 Hypertensive heart disease with heart failure: Secondary | ICD-10-CM | POA: Diagnosis not present

## 2019-01-04 DIAGNOSIS — I739 Peripheral vascular disease, unspecified: Secondary | ICD-10-CM

## 2019-01-04 DIAGNOSIS — I255 Ischemic cardiomyopathy: Secondary | ICD-10-CM

## 2019-01-04 DIAGNOSIS — I10 Essential (primary) hypertension: Secondary | ICD-10-CM

## 2019-01-04 NOTE — Telephone Encounter (Signed)
VIDEO/Doximity visit on 01/04/2019     Phone Call to obtain consent   - 01/04/2019         Virtual Visit Pre-Appointment Phone Call  "(Name), I am calling you today to discuss your upcoming appointment. We are currently trying to limit exposure to the virus that causes COVID-19 by seeing patients at home rather than in the office."  1. "What is the BEST phone number to call the day of the visit?" - include this in appointment notes  2. Do you have or have access to (through a family member/friend) a smartphone with video capability that we can use for your visit?" a. If yes - list this number in appt notes as cell (if different from BEST phone #) and list the appointment type as a VIDEO visit in appointment notes b. If no - list the appointment type as a PHONE visit in appointment notes  3. Confirm consent - "In the setting of the current Covid19 crisis, you are scheduled for a (phone or video) visit with your provider on (date) at (time).  Just as we do with many in-office visits, in order for you to participate in this visit, we must obtain consent.  If you'd like, I can send this to your mychart (if signed up) or email for you to review.  Otherwise, I can obtain your verbal consent now.  All virtual visits are billed to your insurance company just like a normal visit would be.  By agreeing to a virtual visit, we'd like you to understand that the technology does not allow for your provider to perform an examination, and thus may limit your provider's ability to fully assess your condition. If your provider identifies any concerns that need to be evaluated in person, we will make arrangements to do so.  Finally, though the technology is pretty good, we cannot assure that it will always work on either your or our end, and in the setting of a video visit, we may have to convert it to a phone-only visit.  In either situation, we cannot ensure that we have a secure connection.  Are you  willing to proceed?" STAFF: Did the patient verbally acknowledge consent to telehealth visit? Document YES/NO here: YES  4. Advise patient to be prepared - "Two hours prior to your appointment, go ahead and check your blood pressure, pulse, oxygen saturation, and your weight (if you have the equipment to check those) and write them all down. When your visit starts, your provider will ask you for this information. If you have an Apple Watch or Kardia device, please plan to have heart rate information ready on the day of your appointment. Please have a pen and paper handy nearby the day of the visit as well."  5. Give patient instructions for MyChart download to smartphone OR Doximity/Doxy.me as below if video visit (depending on what platform provider is using)  6. Inform patient they will receive a phone call 15 minutes prior to their appointment time (may be from unknown caller ID) so they should be prepared to answer    TELEPHONE CALL NOTE  Dean Bean has been deemed a candidate for a follow-up tele-health visit to limit community exposure during the Covid-19 pandemic. I spoke with the patient via phone to ensure availability of phone/video source, confirm preferred email & phone number, and discuss instructions and expectations.  I reminded Dean Bean to be prepared with any vital sign and/or heart rhythm information that could potentially  be obtained via home monitoring, at the time of his visit. I reminded Dean Bean to expect a phone call prior to his visit.  Thayer Headings 01/04/2019 8:28 AM   INSTRUCTIONS FOR DOWNLOADING THE MYCHART APP TO SMARTPHONE  - The patient must first make sure to have activated MyChart and know their login information - If Apple, go to App Store and type in MyChart in the search bar and download the app. If Android, ask patient to go to [employer] and type in [place] in the search bar and download the app. The app is free but as with  any other app downloads, their phone may require them to verify saved payment information or Apple/Android password.  - The patient will need to then log into the app with their MyChart username and password, and select [facility] as their healthcare provider to link the account. When it is time for your visit, go to the MyChart app, find appointments, and click Begin Video Visit. Be sure to Select Allow for your device to access the Microphone and Camera for your visit. You will then be connected, and your provider will be with you shortly.  **If they have any issues connecting, or need assistance please contact MyChart service desk [phone])**  **If using a computer, in order to ensure the best quality for their visit they will need to use either of the following Internet Browsers: [employer], or Google Chrome**  IF USING DOXIMITY or DOXY.ME - The patient will receive a link just prior to their visit by text.     FULL LENGTH CONSENT FOR TELE-HEALTH VISIT   I hereby voluntarily request, consent and authorize [facility] and its employed or contracted physicians, physician assistants, nurse practitioners or other licensed health care professionals (the Practitioner), to provide me with telemedicine health care services (the Services") as deemed necessary by the treating Practitioner. I acknowledge and consent to receive the Services by the Practitioner via telemedicine. I understand that the telemedicine visit will involve communicating with the Practitioner through live audiovisual communication technology and the disclosure of certain medical information by electronic transmission. I acknowledge that I have been given the opportunity to request an in-person assessment or other available alternative prior to the telemedicine visit and am voluntarily participating in the telemedicine visit.  I understand that I have the right to withhold or withdraw my consent to the use of  telemedicine in the course of my care at any time, without affecting my right to future care or treatment, and that the Practitioner or I may terminate the telemedicine visit at any time. I understand that I have the right to inspect all information obtained and/or recorded in the course of the telemedicine visit and may receive copies of available information for a reasonable fee.  I understand that some of the potential risks of receiving the Services via telemedicine include:   Delay or interruption in medical evaluation due to technological equipment failure or disruption;  Information transmitted may not be sufficient (e.g. poor resolution of images) to allow for appropriate medical decision making by the Practitioner; and/or   In rare instances, security protocols could fail, causing a breach of personal health information.  Furthermore, I acknowledge that it is my responsibility to provide information about my medical history, conditions and care that is complete and accurate to the best of my ability. I acknowledge that Practitioner's advice, recommendations, and/or decision may be based on factors not within their control, such as  incomplete or inaccurate data provided by me or distortions of diagnostic images or specimens that may result from electronic transmissions. I understand that the practice of medicine is not an exact science and that Practitioner makes no warranties or guarantees regarding treatment outcomes. I acknowledge that I will receive a copy of this consent concurrently upon execution via email to the email address I last provided but may also request a printed copy by calling the office of [facility].    I understand that my insurance will be billed for this visit.   I have read or had this consent read to me.  I understand the contents of this consent, which adequately explains the benefits and risks of the Services being provided via telemedicine.   I have been  provided ample opportunity to ask questions regarding this consent and the Services and have had my questions answered to my satisfaction.  I give my informed consent for the services to be provided through the use of telemedicine in my medical care  By participating in this telemedicine visit I agree to the above.

## 2019-01-04 NOTE — Progress Notes (Signed)
Virtual Visit via Video Note   This visit type was conducted due to national recommendations for restrictions regarding the COVID-19 Pandemic (e.g. social distancing) in an effort to limit this patient's exposure and mitigate transmission in our community.  Due to his co-morbid illnesses, this patient is at least at moderate risk for complications without adequate follow up.  This format is felt to be most appropriate for this patient at this time.  All issues noted in this document were discussed and addressed.  A limited physical exam was performed with this format.  Please refer to the patient's chart for his consent to telehealth for [facility].   Evaluation Performed:  Follow-up visit  Date:  01/04/2019   ID:  Dean Bean, DOB Oct 29, 1930, MRN [id number]  Patient Location: Home Provider Location: Home  PCP:  Harlan Stains, MD  Cardiologist:  Lauree Chandler, MD  Electrophysiologist:  None   Chief Complaint:  Follow up- CAD  History of Present Illness:    Dean Bean is a 85 y.o. male with history of CAD s/p 4V CABG in 2013, atrial flutter/fib s/p ablation 2013, ischemic cardiomyopathy, chronic systolic CHF and COPD who is being seen today by virtual e-visit due to the [place] pandemic. I met him as a new patient to establish cardiology care on 10/02/16. He had been followed in [place] by Dr. Rozetta Nunnery but moved to [facility] at the end of 2017. Cardiac history includes 4V CABG in November 2013 (LIMA to LAD, SVG to OM, SVG to PDA, SVG to Diagonal) and stenting of the left main artery in 2016 (DES placed from left main into the diagonal not protected by LIMA graft). He had an atrial flutter ablation in 2013. He had an echo in 2016 with normal LVEF and mild MR. He is also known to have PAD. He was told he had blockages in his legs and was seen by vascular surgery but he says they said there was nothing to worry about. Former smoker (50 pack  years, stopped smoking in 1998). He was admitted to [facility] April 2018 with dyspnea and was felt to be volume overloaded. He was diuresed with IV Lasix and had rapid improvement in his dyspnea. Echo 12/21/16 with LVEF=40-45% with mild MR.  He tells me today that he feels well. No chest pain, dyspnea, leg pain, lower extremity edema.   The patient does not have symptoms concerning for COVID-19 infection (fever, chills, cough, or new shortness of breath).    Past Medical History:  Diagnosis Date  . Atrial fibrillation ([place])   . CAD (coronary artery disease)    s/p CABG  . COPD (chronic obstructive pulmonary disease) ([place])   . HTN (hypertension)   . Ischemic cardiomyopathy   . Mitral regurgitation   . Systolic CHF [facility])    Past Surgical History:  Procedure Laterality Date  . CORONARY ARTERY BYPASS GRAFT     4Vessel     Current Meds  Medication Sig  . aspirin EC 81 MG tablet Take 81 mg by mouth daily.  . carvedilol (COREG) 6.25 MG tablet Take 6.25 mg by mouth 2 (two) times daily.  . clopidogrel (PLAVIX) 75 MG tablet Take 75 mg by mouth daily.  . furosemide (LASIX) 20 MG tablet Take 20 mg by mouth daily as needed.  Marland Kitchen lisinopril (PRINIVIL,ZESTRIL) 5 MG tablet Take 1 tablet (5 mg total) by mouth daily.  . simvastatin (ZOCOR) 20 MG tablet Take 20 mg by mouth daily.  . [  DISCONTINUED] NON FORMULARY at bedtime. CPAP     Allergies:   Patient has no known allergies.   Social History   Tobacco Use  . Smoking status: Former Smoker    Packs/day: 1.00    Years: 50.00    Pack years: 50.00    Types: Cigarettes    Last attempt to quit: 10/02/1996    Years since quitting: 22.2  . Smokeless tobacco: Never Used  Substance Use Topics  . Alcohol use: Yes    Alcohol/week: 7.0 standard drinks    Types: 7 Glasses of wine per week  . Drug use: No     Family Hx: The patient's family history includes Cancer in his mother; Heart attack (age of onset: 52) in his father.  ROS:   Please see the  history of present illness.    All other systems reviewed and are negative.   Prior CV studies:   The following studies were reviewed today:  Echo 12/21/16: Left ventricle: The cavity size was normal. There was mild concentric hypertrophy. Systolic function was mildly to moderately reduced. The estimated ejection fraction was in the range of 40% to 45%. Wall motion was normal; there were no regional wall motion abnormalities. Doppler parameters are consistent with abnormal left ventricular relaxation (grade 1 diastolic dysfunction). There was no evidence of elevated ventricular filling pressure by Doppler parameters. - Aortic valve: Trileaflet; normal thickness leaflets. - Aortic root: The aortic root was normal in size. - Ascending aorta: The ascending aorta was normal in size. - Mitral valve: There was mild regurgitation directed centrally. - Left atrium: The atrium was mildly dilated. - Right ventricle: The cavity size was normal. Wall thickness was normal. Systolic function was normal. - Right atrium: The atrium was normal in size. - Tricuspid valve: There was trivial regurgitation. - Inferior vena cava: The vessel was normal in size. - Pericardium, extracardiac: There was no pericardial effusion.  Impressions:  - There is akinesis of the basal and mid inferolateral and basal inferior walls. Overall LVEF is mildly impaired estimated at 40-45%. RVEF is normal.  Labs/Other Tests and Data Reviewed:    EKG:  No ECG reviewed.  Recent Labs: No results found for requested labs within last 8760 hours.   Recent Lipid Panel No results found for: CHOL, TRIG, HDL, CHOLHDL, LDLCALC, LDLDIRECT  Wt Readings from Last 3 Encounters:  01/04/19 153 lb (69.4 kg)  06/08/18 159 lb 6.4 oz (72.3 kg)  12/06/17 158 lb 12.8 oz (72 kg)     Objective:    Vital Signs:  BP (!) 169/87   Pulse 87   Ht '5\' 8"'  (1.727 m)   Wt 153 lb (69.4 kg)   BMI 23.26 kg/m     Converted to telephone visit due to problems with patient's webcam. NO exam performed.   ASSESSMENT & PLAN:     1. CAD s/p CABG without angina: He had CABG in 2013 and then had stenting of his left main into the diagonal branch in 2016. This branch was unprotected by the grafts. He has no chest pain. Will continue ASA, Plavix, beta blocker and statin.       2. Ischemic cardiomyopathy: Mild LV systolic dysfunction by echo in April 2018 with LVEF=45%. Will continue Ace-inh and beta blocker.   3. Chronic systolic CHF:  He reports stable weight and no LE edema. Continue Lasix.   4. PAD: He has known PAD by non-invasive studies at [facility] in 2014 with ABI 0.76 on the left  and 0.46 on the right. We have spoken in the past about repeating ABI testing here but he has wished to delay. He will let us know if he has pain in his legs.    5. Atrial fibrillation/flutter: He is s/p ablation. He has not been on long term anticoagulation since he has has no recurrence of his AF following ablation. No palpitations.   6. Former tobacco abuse: He stopped smoking in 1998.   7. Mitral regurgitation: Mild by echo April 2018. Will follow  8. HTN: BP controlled at home per pt  COVID-19 Education: The signs and symptoms of COVID-19 were discussed with the patient and how to seek care for testing (follow up with PCP or arrange E-visit).  The importance of social distancing was discussed today.  Time:   Today, I have spent 15  minutes with the patient with telehealth technology discussing the above problems.     Medication Adjustments/Labs and Tests Ordered: Current medicines are reviewed at length with the patient today.  Concerns regarding medicines are outlined above.   Tests Ordered: No orders of the defined types were placed in this encounter.   Medication Changes: No orders of the defined types were placed in this encounter.   Disposition:  Follow up in 6 month(s)  Signed,  Lauree Chandler, MD  01/04/2019 10:00 AM    [facility]

## 2019-01-04 NOTE — Patient Instructions (Signed)
Medication Instructions:  No changes If you need a refill on your cardiac medications before your next appointment, please call your pharmacy.   Lab work: none  Testing/Procedures: none  Follow-Up: At [facility], you and your health needs are our priority.  As part of our continuing mission to provide you with exceptional heart care, we have created designated Provider Care Teams.  These Care Teams include your primary Cardiologist (physician) and Advanced Practice Providers (APPs -  Physician Assistants and Nurse Practitioners) who all work together to provide you with the care you need, when you need it. You will need a follow up appointment in 6 months.  Please call our office 2 months in advance to schedule this appointment.  You may see Christopher McAlhany, MD or one of the following Advanced Practice Providers on your designated Care Team:  Brittainy Simmons, PA-C Dayna Dunn, PA-C . Michele Lenze, PA-C  Any Other Special Instructions Will Be Listed Below (If Applicable).    

## 2019-01-05 ENCOUNTER — Ambulatory Visit: Payer: Medicare Other | Admitting: Cardiovascular Disease

## 2019-01-13 DIAGNOSIS — E785 Hyperlipidemia, unspecified: Secondary | ICD-10-CM | POA: Diagnosis not present

## 2019-01-13 DIAGNOSIS — I129 Hypertensive chronic kidney disease with stage 1 through stage 4 chronic kidney disease, or unspecified chronic kidney disease: Secondary | ICD-10-CM | POA: Diagnosis not present

## 2019-06-05 DIAGNOSIS — R509 Fever, unspecified: Secondary | ICD-10-CM | POA: Diagnosis not present

## 2019-07-06 DIAGNOSIS — K4090 Unilateral inguinal hernia, without obstruction or gangrene, not specified as recurrent: Secondary | ICD-10-CM | POA: Diagnosis not present

## 2019-07-06 DIAGNOSIS — I2510 Atherosclerotic heart disease of native coronary artery without angina pectoris: Secondary | ICD-10-CM | POA: Diagnosis not present

## 2019-07-06 DIAGNOSIS — Z0000 Encounter for general adult medical examination without abnormal findings: Secondary | ICD-10-CM | POA: Diagnosis not present

## 2019-07-06 DIAGNOSIS — N1831 Chronic kidney disease, stage 3a: Secondary | ICD-10-CM | POA: Diagnosis not present

## 2019-07-06 DIAGNOSIS — I700 Atherosclerosis of aorta: Secondary | ICD-10-CM | POA: Diagnosis not present

## 2019-07-06 DIAGNOSIS — I739 Peripheral vascular disease, unspecified: Secondary | ICD-10-CM | POA: Diagnosis not present

## 2019-07-06 DIAGNOSIS — E785 Hyperlipidemia, unspecified: Secondary | ICD-10-CM | POA: Diagnosis not present

## 2019-07-06 DIAGNOSIS — I129 Hypertensive chronic kidney disease with stage 1 through stage 4 chronic kidney disease, or unspecified chronic kidney disease: Secondary | ICD-10-CM | POA: Diagnosis not present

## 2019-07-06 DIAGNOSIS — I5020 Unspecified systolic (congestive) heart failure: Secondary | ICD-10-CM | POA: Diagnosis not present

## 2019-07-06 DIAGNOSIS — I255 Ischemic cardiomyopathy: Secondary | ICD-10-CM | POA: Diagnosis not present

## 2019-08-17 NOTE — Progress Notes (Signed)
Evaluation Performed:  Follow-up visit  Date:  08/18/2019   ID:  Dean Bean, DOB [birth year], MRN [id number]  Chief Complaint:   Chief Complaint  Patient presents with  . Follow-up    CAD   History of Present Illness:    Dean Bean is a 85 y.o. male with history of CAD s/p 4V CABG in 2013, atrial flutter/fib s/p ablation 2013, ischemic cardiomyopathy, chronic systolic CHF and COPD who is here today for cardiac follow up. I met him as a new patient to establish cardiology care on 10/02/16. He had been followed in [place] by Dr. Rozetta Nunnery but moved to [facility] at the end of 2017. Cardiac history includes 4V CABG in November 2013 (LIMA to LAD, SVG to OM, SVG to PDA, SVG to Diagonal) and stenting of the left main artery in 2016 (DES placed from left main into the diagonal not protected by LIMA graft). He had an atrial flutter ablation in 2013. He had an echo in 2016 with normal LVEF and mild MR. He is also known to have PAD. He was told he had blockages in his legs and was seen by vascular surgery but he says they said there was nothing to worry about. Former smoker (50 pack years, stopped smoking in 1998). He was admitted to [facility] April 2018 with dyspnea and was felt to be volume overloaded. He was diuresed with IV Lasix and had rapid improvement in his dyspnea. Echo 12/21/16 with LVEF=40-45% with mild MR.  He is here today for follow up. The patient denies any chest pain, dyspnea, palpitations, lower extremity edema, orthopnea, PND, dizziness, near syncope or syncope. Lisinopril stopped last month due to hypotension.   Primary Care Physician: Harlan Stains, MD   Past Medical History:  Diagnosis Date  . Atrial fibrillation ([place])   . CAD (coronary artery disease)    s/p CABG  . COPD (chronic obstructive pulmonary disease) ([place])   . HTN (hypertension)   . Ischemic cardiomyopathy   . Mitral regurgitation   . Systolic CHF [facility])    Past Surgical History:   Procedure Laterality Date  . CORONARY ARTERY BYPASS GRAFT     4Vessel     Current Meds  Medication Sig  . carvedilol (COREG) 6.25 MG tablet Take 6.25 mg by mouth 2 (two) times daily.  . clopidogrel (PLAVIX) 75 MG tablet Take 75 mg by mouth daily.  Marland Kitchen Dextromethorphan-guaiFENesin (CORICIDIN HBP CONGESTION/COUGH PO) Take 1 tablet by mouth as needed.  . furosemide (LASIX) 20 MG tablet Take 20 mg by mouth daily as needed.  . simvastatin (ZOCOR) 20 MG tablet Take 20 mg by mouth daily.  . [DISCONTINUED] aspirin EC 81 MG tablet Take 81 mg by mouth daily.     Allergies:   Patient has no known allergies.   Social History   Tobacco Use  . Smoking status: Former Smoker    Packs/day: 1.00    Years: 50.00    Pack years: 50.00    Types: Cigarettes    Quit date: 10/02/1996    Years since quitting: 22.8  . Smokeless tobacco: Never Used  Substance Use Topics  . Alcohol use: Yes    Alcohol/week: 7.0 standard drinks    Types: 7 Glasses of wine per week  . Drug use: No     Family Hx: The patient's family history includes Cancer in his mother; Heart attack (age of onset: 29) in his father.  ROS:   Please see the history of present illness.  All other systems reviewed and are negative.   Prior CV studies:   The following studies were reviewed today:  Echo 12/21/16: Left ventricle: The cavity size was normal. There was mild concentric hypertrophy. Systolic function was mildly to moderately reduced. The estimated ejection fraction was in the range of 40% to 45%. Wall motion was normal; there were no regional wall motion abnormalities. Doppler parameters are consistent with abnormal left ventricular relaxation (grade 1 diastolic dysfunction). There was no evidence of elevated ventricular filling pressure by Doppler parameters. - Aortic valve: Trileaflet; normal thickness leaflets. - Aortic root: The aortic root was normal in size. - Ascending aorta: The ascending aorta  was normal in size. - Mitral valve: There was mild regurgitation directed centrally. - Left atrium: The atrium was mildly dilated. - Right ventricle: The cavity size was normal. Wall thickness was normal. Systolic function was normal. - Right atrium: The atrium was normal in size. - Tricuspid valve: There was trivial regurgitation. - Inferior vena cava: The vessel was normal in size. - Pericardium, extracardiac: There was no pericardial effusion.  Impressions:  - There is akinesis of the basal and mid inferolateral and basal inferior walls. Overall LVEF is mildly impaired estimated at 40-45%. RVEF is normal.  Labs/Other Tests and Data Reviewed:    EKG:  The EKG is performed today The EKG is personally reviewed by me and shows Sinus rhyth. 1st degree AV block. IVCD.   Recent Labs: No results found for requested labs within last 8760 hours.   Recent Lipid Panel No results found for: CHOL, TRIG, HDL, CHOLHDL, LDLCALC, LDLDIRECT  Wt Readings from Last 3 Encounters:  08/18/19 155 lb 12.8 oz (70.7 kg)  01/04/19 153 lb (69.4 kg)  06/08/18 159 lb 6.4 oz (72.3 kg)     Objective:    Vital Signs:  BP 138/80   Pulse 75   Ht '5\' 8"'  (1.727 m)   Wt 155 lb 12.8 oz (70.7 kg)   SpO2 98%   BMI 23.69 kg/m    General: Well developed, well nourished, NAD  HEENT: OP clear, mucus membranes moist  SKIN: warm, dry. No rashes. Neuro: No focal deficits  Musculoskeletal: Muscle strength 5/5 all ext  Psychiatric: Mood and affect normal  Neck: No JVD, no carotid bruits, no thyromegaly, no lymphadenopathy.  Lungs:Clear bilaterally, no wheezes, rhonci, crackles Cardiovascular: Regular rate and rhythm. No murmurs, gallops or rubs. Abdomen:Soft. Bowel sounds present. Non-tender.  Extremities: No lower extremity edema. Pulses are 2 + in the bilateral DP/PT.    ASSESSMENT & PLAN:     1. CAD s/p CABG without angina: He had CABG in 2013 and then had stenting of his left main into the  diagonal branch in 2016. This branch was unprotected by the grafts. No chest pain. Will stop ASA. Continue Plavix, statin and beta blocker.        2. Ischemic cardiomyopathy: Mild LV systolic dysfunction by echo in April 2018 with LVEF=45%. Continue beta blocker. He has not tolerated Ace-inh due to hypotension (stopped in November 2020).    3. Chronic systolic CHF:  Weight is stable. No evidence of volume overload. Continue Lasix.     4. PAD: He has known PAD by non-invasive studies at [facility] in 2014 with ABI 0.76 on the left and 0.46 on the right. We have spoken in the past about repeating ABI testing here but he has wished to delay. He will let us know if he has pain in his legs.  5. Atrial fibrillation/flutter: He is s/p ablation. He has not been on long term anticoagulation since he has has no recurrence of his AF following ablation. No palpitations. Continue beta blocker.   6. Former tobacco abuse: He stopped smoking in 1998.   7. Mitral regurgitation: Mild by echo April 2018. Will follow  8. HTN: BP is well controlled.   Medication Adjustments/Labs and Tests Ordered: Current medicines are reviewed at length with the patient today.  Concerns regarding medicines are outlined above.   Tests Ordered: Orders Placed This Encounter  Procedures  . EKG 12-Lead    Medication Changes: No orders of the defined types were placed in this encounter.   Disposition:  Follow up in 6 month(s)  Signed, Lauree Chandler, MD  08/18/2019 10:44 AM    [facility]

## 2019-08-18 ENCOUNTER — Other Ambulatory Visit: Payer: Self-pay

## 2019-08-18 ENCOUNTER — Ambulatory Visit (INDEPENDENT_AMBULATORY_CARE_PROVIDER_SITE_OTHER): Payer: Medicare Other | Admitting: Cardiovascular Disease

## 2019-08-18 ENCOUNTER — Encounter: Payer: Self-pay | Admitting: Cardiovascular Disease

## 2019-08-18 VITALS — BP 138/80 | HR 75 | Ht 68.0 in | Wt 155.8 lb

## 2019-08-18 DIAGNOSIS — I340 Nonrheumatic mitral (valve) insufficiency: Secondary | ICD-10-CM

## 2019-08-18 DIAGNOSIS — I2510 Atherosclerotic heart disease of native coronary artery without angina pectoris: Secondary | ICD-10-CM | POA: Diagnosis not present

## 2019-08-18 DIAGNOSIS — I480 Paroxysmal atrial fibrillation: Secondary | ICD-10-CM

## 2019-08-18 DIAGNOSIS — I255 Ischemic cardiomyopathy: Secondary | ICD-10-CM

## 2019-08-18 DIAGNOSIS — I10 Essential (primary) hypertension: Secondary | ICD-10-CM

## 2019-08-18 DIAGNOSIS — I5022 Chronic systolic (congestive) heart failure: Secondary | ICD-10-CM | POA: Diagnosis not present

## 2019-08-18 DIAGNOSIS — I739 Peripheral vascular disease, unspecified: Secondary | ICD-10-CM

## 2019-08-18 NOTE — Patient Instructions (Signed)
Medication Instructions:  Your physician has recommended you make the following change in your medication:  1.) STOP aspirin  *If you need a refill on your cardiac medications before your next appointment, please call your pharmacy*  Lab Work: none If you have labs (blood work) drawn today and your tests are completely normal, you will receive your results only by: Marland Kitchen MyChart Message (if you have MyChart) OR . A paper copy in the mail If you have any lab test that is abnormal or we need to change your treatment, we will call you to review the results.  Testing/Procedures: none  Follow-Up: At [facility], you and your health needs are our priority.  As part of our continuing mission to provide you with exceptional heart care, we have created designated Provider Care Teams.  These Care Teams include your primary Cardiologist (physician) and Advanced Practice Providers (APPs -  Physician Assistants and Nurse Practitioners) who all work together to provide you with the care you need, when you need it.  Your next appointment:   6 month(s)  The format for your next appointment:   In Person  Provider:   Lauree Chandler, MD  Other Instructions

## 2019-08-30 DIAGNOSIS — Z20828 Contact with and (suspected) exposure to other viral communicable diseases: Secondary | ICD-10-CM | POA: Diagnosis not present

## 2019-09-08 ENCOUNTER — Other Ambulatory Visit: Payer: Self-pay

## 2019-09-08 ENCOUNTER — Emergency Department (HOSPITAL_BASED_OUTPATIENT_CLINIC_OR_DEPARTMENT_OTHER): Payer: Medicare Other

## 2019-09-08 ENCOUNTER — Inpatient Hospital Stay (HOSPITAL_BASED_OUTPATIENT_CLINIC_OR_DEPARTMENT_OTHER)
Admission: EM | Admit: 2019-09-08 | Discharge: 2019-09-09 | DRG: 304 | Disposition: A | Discharge status: Home / Self Care | Payer: Medicare Other | Attending: Internal Medicine | Admitting: Internal Medicine

## 2019-09-08 ENCOUNTER — Encounter (HOSPITAL_COMMUNITY): Payer: Self-pay | Admitting: Internal Medicine

## 2019-09-08 DIAGNOSIS — I248 Other forms of acute ischemic heart disease: Secondary | ICD-10-CM | POA: Diagnosis present

## 2019-09-08 DIAGNOSIS — I5043 Acute on chronic combined systolic (congestive) and diastolic (congestive) heart failure: Secondary | ICD-10-CM | POA: Diagnosis present

## 2019-09-08 DIAGNOSIS — I340 Nonrheumatic mitral (valve) insufficiency: Secondary | ICD-10-CM | POA: Diagnosis present

## 2019-09-08 DIAGNOSIS — Z951 Presence of aortocoronary bypass graft: Secondary | ICD-10-CM

## 2019-09-08 DIAGNOSIS — I110 Hypertensive heart disease with heart failure: Secondary | ICD-10-CM | POA: Diagnosis present

## 2019-09-08 DIAGNOSIS — I482 Chronic atrial fibrillation, unspecified: Secondary | ICD-10-CM | POA: Diagnosis not present

## 2019-09-08 DIAGNOSIS — I10 Essential (primary) hypertension: Secondary | ICD-10-CM

## 2019-09-08 DIAGNOSIS — R0603 Acute respiratory distress: Secondary | ICD-10-CM

## 2019-09-08 DIAGNOSIS — Z79899 Other long term (current) drug therapy: Secondary | ICD-10-CM

## 2019-09-08 DIAGNOSIS — J9601 Acute respiratory failure with hypoxia: Secondary | ICD-10-CM | POA: Diagnosis present

## 2019-09-08 DIAGNOSIS — Z87891 Personal history of nicotine dependence: Secondary | ICD-10-CM

## 2019-09-08 DIAGNOSIS — Z8249 Family history of ischemic heart disease and other diseases of the circulatory system: Secondary | ICD-10-CM | POA: Diagnosis not present

## 2019-09-08 DIAGNOSIS — I5023 Acute on chronic systolic (congestive) heart failure: Secondary | ICD-10-CM

## 2019-09-08 DIAGNOSIS — Z20828 Contact with and (suspected) exposure to other viral communicable diseases: Secondary | ICD-10-CM | POA: Diagnosis present

## 2019-09-08 DIAGNOSIS — I2510 Atherosclerotic heart disease of native coronary artery without angina pectoris: Secondary | ICD-10-CM | POA: Diagnosis present

## 2019-09-08 DIAGNOSIS — I4891 Unspecified atrial fibrillation: Secondary | ICD-10-CM | POA: Diagnosis present

## 2019-09-08 DIAGNOSIS — R0602 Shortness of breath: Secondary | ICD-10-CM | POA: Diagnosis not present

## 2019-09-08 DIAGNOSIS — I4892 Unspecified atrial flutter: Secondary | ICD-10-CM | POA: Diagnosis present

## 2019-09-08 DIAGNOSIS — J449 Chronic obstructive pulmonary disease, unspecified: Secondary | ICD-10-CM

## 2019-09-08 DIAGNOSIS — I161 Hypertensive emergency: Secondary | ICD-10-CM | POA: Diagnosis not present

## 2019-09-08 DIAGNOSIS — J9600 Acute respiratory failure, unspecified whether with hypoxia or hypercapnia: Secondary | ICD-10-CM | POA: Diagnosis present

## 2019-09-08 DIAGNOSIS — Z7902 Long term (current) use of antithrombotics/antiplatelets: Secondary | ICD-10-CM | POA: Diagnosis not present

## 2019-09-08 DIAGNOSIS — R0902 Hypoxemia: Secondary | ICD-10-CM

## 2019-09-08 LAB — URINALYSIS, MICROSCOPIC (REFLEX)

## 2019-09-08 LAB — COMPREHENSIVE METABOLIC PANEL
ALT: 18 U/L (ref 0–44)
AST: 19 U/L (ref 15–41)
Albumin: 4.3 g/dL (ref 3.5–5.0)
Alkaline Phosphatase: 64 U/L (ref 38–126)
Anion gap: 9 (ref 5–15)
BUN: 24 mg/dL — ABNORMAL HIGH (ref 8–23)
CO2: 26 mmol/L (ref 22–32)
Calcium: 9.1 mg/dL (ref 8.9–10.3)
Chloride: 104 mmol/L (ref 98–111)
Creatinine, Ser: 1.43 mg/dL — ABNORMAL HIGH (ref 0.61–1.24)
GFR calc Af Amer: 51 mL/min — ABNORMAL LOW (ref >60)
GFR calc non Af Amer: 44 mL/min — ABNORMAL LOW (ref >60)
Glucose, Bld: 131 mg/dL — ABNORMAL HIGH (ref 70–99)
Potassium: 4.4 mmol/L (ref 3.5–5.1)
Sodium: 139 mmol/L (ref 135–145)
Total Bilirubin: 0.9 mg/dL (ref 0.3–1.2)
Total Protein: 7.6 g/dL (ref 6.5–8.1)

## 2019-09-08 LAB — CBC WITH DIFFERENTIAL/PLATELET
Abs Immature Granulocytes: 0.09 10*3/uL — ABNORMAL HIGH (ref 0.00–0.07)
Basophils Absolute: 0.1 10*3/uL (ref 0.0–0.1)
Basophils Relative: 1 %
Eosinophils Absolute: 0.3 10*3/uL (ref 0.0–0.5)
Eosinophils Relative: 2 %
HCT: 45.4 % (ref 39.0–52.0)
Hemoglobin: 14.9 g/dL (ref 13.0–17.0)
Immature Granulocytes: 1 %
Lymphocytes Relative: 9 %
Lymphs Abs: 1.3 10*3/uL (ref 0.7–4.0)
MCH: 31 pg (ref 26.0–34.0)
MCHC: 32.8 g/dL (ref 30.0–36.0)
MCV: 94.6 fL (ref 80.0–100.0)
Monocytes Absolute: 0.9 10*3/uL (ref 0.1–1.0)
Monocytes Relative: 6 %
Neutro Abs: 11.7 10*3/uL — ABNORMAL HIGH (ref 1.7–7.7)
Neutrophils Relative %: 81 %
Platelets: 319 10*3/uL (ref 150–400)
RBC: 4.8 MIL/uL (ref 4.22–5.81)
RDW: 12.8 % (ref 11.5–15.5)
WBC: 14.4 10*3/uL — ABNORMAL HIGH (ref 4.0–10.5)
nRBC: 0 % (ref 0.0–0.2)

## 2019-09-08 LAB — TROPONIN I (HIGH SENSITIVITY)
Troponin I (High Sensitivity): 16 ng/L (ref <18)
Troponin I (High Sensitivity): 38 ng/L — ABNORMAL HIGH (ref <18)
Troponin I (High Sensitivity): 46 ng/L — ABNORMAL HIGH (ref <18)
Troponin I (High Sensitivity): 37 ng/L — ABNORMAL HIGH (ref <18)

## 2019-09-08 LAB — URINALYSIS, ROUTINE W REFLEX MICROSCOPIC
Bilirubin Urine: NEGATIVE
Glucose, UA: NEGATIVE mg/dL
Ketones, ur: NEGATIVE mg/dL
Leukocytes,Ua: NEGATIVE
Nitrite: NEGATIVE
Protein, ur: NEGATIVE mg/dL
Specific Gravity, Urine: 1.025 (ref 1.005–1.030)
pH: 6 (ref 5.0–8.0)

## 2019-09-08 LAB — PROTIME-INR
INR: 1 (ref 0.8–1.2)
Prothrombin Time: 13 seconds (ref 11.4–15.2)

## 2019-09-08 LAB — SARS CORONAVIRUS 2 (TAT 6-24 HRS): SARS Coronavirus 2: NEGATIVE

## 2019-09-08 LAB — SARS CORONAVIRUS 2 AG (30 MIN TAT): SARS Coronavirus 2 Ag: NEGATIVE

## 2019-09-08 LAB — BRAIN NATRIURETIC PEPTIDE: B Natriuretic Peptide: 436.8 pg/mL — ABNORMAL HIGH (ref 0.0–100.0)

## 2019-09-08 MED ORDER — SODIUM CHLORIDE 0.9% FLUSH: 3.0000 mL | INTRAVENOUS | Status: DC | PRN

## 2019-09-08 MED ORDER — IPRATROPIUM-ALBUTEROL 20-100 MCG/ACT IN AERS: 1.0000 | INHALATION_SPRAY | Freq: Four times a day (QID) | RESPIRATORY_TRACT | Status: DC | PRN

## 2019-09-08 MED ORDER — NITROGLYCERIN IN D5W 200-5 MCG/ML-% IV SOLN
0.0000 ug/min | INTRAVENOUS | Status: DC
  Administered 2019-09-08: 09:00:00 5 ug/min via INTRAVENOUS
  Filled 2019-09-08: qty 250

## 2019-09-08 MED ORDER — CLOPIDOGREL BISULFATE 75 MG PO TABS
75.0000 mg | ORAL_TABLET | Freq: Every day | ORAL | Status: DC
  Administered 2019-09-08 – 2019-09-09 (×2): 75 mg via ORAL
  Filled 2019-09-08 (×2): qty 1

## 2019-09-08 MED ORDER — FUROSEMIDE 10 MG/ML IJ SOLN
80.0000 mg | Freq: Once | INTRAMUSCULAR | Status: AC
  Administered 2019-09-08: 09:00:00 80 mg via INTRAVENOUS
  Filled 2019-09-08: qty 8

## 2019-09-08 MED ORDER — IPRATROPIUM-ALBUTEROL 0.5-2.5 (3) MG/3ML IN SOLN: 3.0000 mL | Freq: Four times a day (QID) | RESPIRATORY_TRACT | Status: DC | PRN

## 2019-09-08 MED ORDER — ALBUTEROL SULFATE (2.5 MG/3ML) 0.083% IN NEBU
2.5000 mg | INHALATION_SOLUTION | Freq: Once | RESPIRATORY_TRACT | Status: DC
  Filled 2019-09-08: qty 3

## 2019-09-08 MED ORDER — ONDANSETRON HCL 4 MG/2ML IJ SOLN
INTRAMUSCULAR | Status: AC
  Administered 2019-09-08: 4 mg via INTRAVENOUS
  Filled 2019-09-08: qty 2

## 2019-09-08 MED ORDER — CARVEDILOL 6.25 MG PO TABS
6.2500 mg | ORAL_TABLET | Freq: Two times a day (BID) | ORAL | Status: DC
  Administered 2019-09-08 – 2019-09-09 (×2): 6.25 mg via ORAL
  Filled 2019-09-08 (×2): qty 1

## 2019-09-08 MED ORDER — ONDANSETRON HCL 4 MG/2ML IJ SOLN: 4.0000 mg | Freq: Once | INTRAMUSCULAR | Status: AC

## 2019-09-08 MED ORDER — SODIUM CHLORIDE 0.9% FLUSH
3.0000 mL | Freq: Two times a day (BID) | INTRAVENOUS | Status: DC
  Administered 2019-09-08 – 2019-09-09 (×2): 3 mL via INTRAVENOUS

## 2019-09-08 MED ORDER — ACETAMINOPHEN 325 MG PO TABS: 650.0000 mg | ORAL_TABLET | ORAL | Status: DC | PRN

## 2019-09-08 MED ORDER — SODIUM CHLORIDE 0.9 % IV SOLN: 250.0000 mL | INTRAVENOUS | Status: DC | PRN

## 2019-09-08 MED ORDER — METHYLPREDNISOLONE SODIUM SUCC 125 MG IJ SOLR
125.0000 mg | Freq: Once | INTRAMUSCULAR | Status: AC
  Administered 2019-09-08: 09:00:00 125 mg via INTRAVENOUS
  Filled 2019-09-08: qty 2

## 2019-09-08 MED ORDER — ONDANSETRON HCL 4 MG/2ML IJ SOLN: 4.0000 mg | Freq: Four times a day (QID) | INTRAMUSCULAR | Status: DC | PRN

## 2019-09-08 MED ORDER — SIMVASTATIN 20 MG PO TABS
20.0000 mg | ORAL_TABLET | Freq: Every day | ORAL | Status: DC
  Administered 2019-09-08: 20 mg via ORAL
  Filled 2019-09-08 (×2): qty 1

## 2019-09-08 NOTE — Progress Notes (Addendum)
12-lead EKG completed with cardiology at bedside. Troponin elevated. Patient denies any chest pain at this time and is stable. Will continue to monitor.  Correction: Dr. Marva Panda at bedside.

## 2019-09-08 NOTE — ED Provider Notes (Signed)
[facility] EMERGENCY DEPARTMENT Provider Note   CSN: [id number] Arrival date & time: 09/08/19  0757     History Chief Complaint  Patient presents with  . Shortness of Breath    Dean Bean is a 85 y.o. male.  Patient brought in by family member.  Cute onset of shortness of breath at about 7 this morning.  Denies any chest pain denies fever cough.  Patient states felt fine yesterday but he did eat a lot of salty food.  Patient has past medical history significant for history of atrial fibrillation but has had an ablation.  Coronary artery disease status post CABG.  Known to have systolic congestive heart failure.  Reported history of mitral regurgitation and COPD.  Patient smoked 1 pack a day of cigarettes for 50 years.  Quit 1998.  Patient last admission was April 2018 for acute pulmonary edema.  Patient took 20 mg of Lasix because he felt as if he was fluid overloaded.  Patient arrived in significant respiratory distress respiratory rate around 48.  Heart rate 122.  Monitor seem to be consistent with sinus.  And blood pressure was 244/109.  Oxygen saturation on room air was below 90%.  Initially placed on some 2 L of oxygen went up to 93%.        Past Medical History:  Diagnosis Date  . Atrial fibrillation ([place])   . CAD (coronary artery disease)    s/p CABG  . COPD (chronic obstructive pulmonary disease) ([place])   . HTN (hypertension)   . Ischemic cardiomyopathy   . Mitral regurgitation   . Systolic CHF [place])     Patient Active Problem List   Diagnosis Date Noted  . Acute on chronic systolic CHF (congestive heart failure) ([place]) 12/19/2016  . Acute respiratory failure with hypoxia ([place]) 12/19/2016  . Pulmonary nodule 12/19/2016  . HTN (hypertension)   . CAD (coronary artery disease)   . Systolic CHF ([place])   . COPD (chronic obstructive pulmonary disease) ([place])   . Atrial fibrillation ([place])   . Ischemic cardiomyopathy   . Mitral regurgitation     Past  Surgical History:  Procedure Laterality Date  . CORONARY ARTERY BYPASS GRAFT     4Vessel       Family History  Problem Relation Age of Onset  . Cancer Mother   . Heart attack Father 62    Social History   Tobacco Use  . Smoking status: Former Smoker    Packs/day: 1.00    Years: 50.00    Pack years: 50.00    Types: Cigarettes    Quit date: 10/02/1996    Years since quitting: 22.9  . Smokeless tobacco: Never Used  Substance Use Topics  . Alcohol use: Yes    Alcohol/week: 7.0 standard drinks    Types: 7 Glasses of wine per week  . Drug use: No    Home Medications Prior to Admission medications   Medication Sig Start Date End Date Taking? Authorizing Provider  carvedilol (COREG) 6.25 MG tablet Take 6.25 mg by mouth 2 (two) times daily. 07/20/16   [provider]  clopidogrel (PLAVIX) 75 MG tablet Take 75 mg by mouth daily. 07/15/16   [provider]  Dextromethorphan-guaiFENesin (CORICIDIN HBP CONGESTION/COUGH PO) Take 1 tablet by mouth as needed.    [provider]  furosemide (LASIX) 20 MG tablet Take 20 mg by mouth daily as needed.    [provider]  simvastatin (ZOCOR) 20 MG tablet Take 20 mg  by mouth daily. 07/20/16   [provider]    Allergies    Patient has no known allergies.  Review of Systems   Review of Systems  Constitutional: Negative for chills, diaphoresis and fever.  HENT: Negative for rhinorrhea and sore throat.   Eyes: Negative for visual disturbance.  Respiratory: Positive for shortness of breath. Negative for cough.   Cardiovascular: Negative for chest pain and leg swelling.  Gastrointestinal: Negative for abdominal pain, diarrhea, nausea and vomiting.  Genitourinary: Negative for dysuria.  Musculoskeletal: Negative for back pain and neck pain.  Skin: Negative for rash.  Neurological: Negative for dizziness, light-headedness and headaches.  Hematological: Does not bruise/bleed easily.    Psychiatric/Behavioral: Negative for confusion.    Physical Exam Updated Vital Signs BP 124/64   Pulse (!) 102   Resp (!) 22   Ht 1.727 m (5\' 8" )   Wt 70.7 kg   SpO2 95%   BMI 23.69 kg/m   Physical Exam Vitals and nursing note reviewed.  Constitutional:      Appearance: Normal appearance. He is well-developed.  HENT:     Head: Normocephalic and atraumatic.  Eyes:     Extraocular Movements: Extraocular movements intact.     Conjunctiva/sclera: Conjunctivae normal.     Pupils: Pupils are equal, round, and reactive to light.  Cardiovascular:     Rate and Rhythm: Regular rhythm. Tachycardia present.     Heart sounds: No murmur.  Pulmonary:     Effort: Respiratory distress present.     Breath sounds: Rales present.  Abdominal:     Palpations: Abdomen is soft.     Tenderness: There is no abdominal tenderness.  Musculoskeletal:        General: No swelling.     Cervical back: Normal range of motion and neck supple.  Skin:    General: Skin is warm and dry.     Capillary Refill: Capillary refill takes less than 2 seconds.  Neurological:     General: No focal deficit present.     Mental Status: He is alert.     Cranial Nerves: No cranial nerve deficit.     Sensory: No sensory deficit.     Motor: No weakness.     ED Results / Procedures / Treatments   Labs (all labs ordered are listed, but only abnormal results are displayed) Labs Reviewed  CBC WITH DIFFERENTIAL/PLATELET - Abnormal; Notable for the following components:      Result Value   WBC 14.4 (*)    Neutro Abs 11.7 (*)    Abs Immature Granulocytes 0.09 (*)    All other components within normal limits  COMPREHENSIVE METABOLIC PANEL - Abnormal; Notable for the following components:   Glucose, Bld 131 (*)    BUN 24 (*)    Creatinine, Ser 1.43 (*)    GFR calc non Af Amer 44 (*)    GFR calc Af Amer 51 (*)    All other components within normal limits  BRAIN NATRIURETIC PEPTIDE - Abnormal; Notable for the  following components:   B Natriuretic Peptide 436.8 (*)    All other components within normal limits  URINALYSIS, ROUTINE W REFLEX MICROSCOPIC - Abnormal; Notable for the following components:   Hgb urine dipstick TRACE (*)    All other components within normal limits  URINALYSIS, MICROSCOPIC (REFLEX) - Abnormal; Notable for the following components:   Bacteria, UA RARE (*)    All other components within normal limits  SARS CORONAVIRUS 2 AG (30  MIN TAT)  SARS CORONAVIRUS 2 (TAT 6-24 HRS)  PROTIME-INR  TROPONIN I (HIGH SENSITIVITY)    EKG EKG Interpretation  Date/Time:  Friday September 08 2019 08:10:44 EST Ventricular Rate:  118 PR Interval:    QRS Duration: 110 QT Interval:  350 QTC Calculation: 491 R Axis:   42 Text Interpretation: Sinus tachycardia Repol abnrm suggests ischemia, lateral leads Confirmed by Fredia Sorrow [phone]) on 09/08/2019 8:18:59 AM   Radiology DG Chest Port 1 View  Result Date: 09/08/2019 CLINICAL DATA:  85 year old male with acute severe shortness of breath for 90 minutes. Former smoker. COVID-19. Status pending. EXAM: PORTABLE CHEST 1 VIEW COMPARISON:  Portable chest 12/29/2016.  Chest CT 04/20/2018. FINDINGS: Portable AP upright view at 0816 hours. Large lung volumes without significant cardiomegaly on the comparison CT, stable prominent cardiac silhouette likely artifact due to portable technique. Other mediastinal contours are within normal limits. Prior CABG. Visualized tracheal air column is within normal limits. Widespread bilateral pulmonary interstitial markings appear similar but less pronounced than on the Dec 15, 2016 portable chest. No evidence of pleural effusion or pneumothorax. No confluent opacity. No acute osseous abnormality identified. IMPRESSION: 1. No definite acute cardiopulmonary abnormality. Chronic pulmonary hyperinflation and widespread bilateral interstitial markings are probably chronic, and pulmonary vascularity appears normalized  compared to a Dec 15, 2016 radiographs. 2. Prior CABG, but no bona fide cardiomegaly suspected. Electronically Signed   By: Genevie Ann M.D.   On: 09/08/2019 08:50    Procedures Procedures (including critical care time)  CRITICAL CARE Performed by: Fredia Sorrow Total critical care time: 60 minutes Critical care time was exclusive of separately billable procedures and treating other patients. Critical care was necessary to treat or prevent imminent or life-threatening deterioration. Critical care was time spent personally by me on the following activities: development of treatment plan with patient and/or surrogate as well as nursing, discussions with consultants, evaluation of patient's response to treatment, examination of patient, obtaining history from patient or surrogate, ordering and performing treatments and interventions, ordering and review of laboratory studies, ordering and review of radiographic studies, pulse oximetry and re-evaluation of patient's condition.   Medications Ordered in ED Medications  albuterol (PROVENTIL) (2.5 MG/3ML) 0.083% nebulizer solution 2.5 mg (0 mg Nebulization Hold 09/08/19 0940)  nitroGLYCERIN 50 mg in dextrose 5 % 250 mL (0.2 mg/mL) infusion (5 mcg/min Intravenous New Bag/Given 09/08/19 0846)  furosemide (LASIX) injection 80 mg (80 mg Intravenous Given 09/08/19 0834)  methylPREDNISolone sodium succinate (SOLU-MEDROL) 125 mg/2 mL injection 125 mg (125 mg Intravenous Given 09/08/19 0906)  ondansetron (ZOFRAN) injection 4 mg (4 mg Intravenous Given 09/08/19 0900)    ED Course  I have reviewed the triage vital signs and the nursing notes.  Pertinent labs & imaging results that were available during my care of the patient were reviewed by me and considered in my medical decision making (see chart for details).    MDM Rules/Calculators/A&P                      Patient clearly arrived and not significant respiratory distress.  Respiratory history did not seem  to be consistent with COVID-19 infection.  Markedly elevated blood pressure raise concerns for hypertensive emergency.  Patient also has a known history of CHF.  He had already taken some Lasix at home there was concern for flash pulmonary edema.  Patient also had history of COPD but was not doing a lot of wheezing.  Patient not on any home oxygen.  Patient requiring oxygen  now.  Based on all these possibilities he was approached up from several different avenues.  For the markedly elevated blood pressure started on nitro drip.  Given additional 80 of Lasix.  Clinically I felt it was more of a flash pulmonary edema situation.  And for the COPD he was given a nebulizer and Solu-Medrol.  Since patient was in such respiratory distress with a respiratory rate so high.  Although oxygen sats were improving patient was started on BiPAP.  Patient responded well to all 3 of these treatment approaches.  Chest x-ray did not show any significant pulmonary edema patient went on to improved significantly on the BiPAP and as the blood pressure came down.  Patient still requiring 2 L of nasal cannula oxygen now he is off of the BiPAP.  Blood pressure is significantly better 128/75.  Patient diuresed significantly as well.  Patient will require admission by the hospitalist service most appropriate to have him admitted to [facility].  Think since there is possibility of a COPD component feel that patient should be admitted by the hospitalist service and not cardiology.  Patient's troponin slightly elevated at 16 delta troponin will be ordered he had no chest pain.  Patient's BMP elevated in the 400 range but not as high as he has been in the past at 700.  When he required admission for acute pulmonary edema.    Final Clinical Impression(s) / ED Diagnoses Final diagnoses:  Hypertensive emergency  SOB (shortness of breath)  Acute on chronic systolic congestive heart failure ([place])  Hypoxia  Respiratory distress    Rx  / DC Orders ED Discharge Orders    None       Fredia Sorrow, MD 09/08/19 1024

## 2019-09-08 NOTE — H&P (Signed)
History and Physical        Hospital Admission Note Date: 09/08/2019  Patient name: Dean Bean Medical record number: [id number] Date of birth: 1931-01-15 Age: 85 y.o. Gender: male  PCP: Harlan Stains, MD  Patient coming from: [facility] as direct admit, from home. Lives with wife and daughter and grandkids. Ambulates independently  Chief Complaint    Shortness of breath   HPI:   This is an 85 year old male who was a direct admit from [facility] ED with history of hypertension, PAD, atrial fibrillation/flutter status post ablation (2013) not on anticoagulation, distant tobacco use, systolic heart failure (last EF 40 to 45% in 2018), CAD status post CABG (2013) and DES (2016), COPD not on home O2, who presented to [facility] a.m. 09/08/2019 for acute onset shortness of breath since this morning.  According to previous notes, patient denied chest pain, fever, cough and was in usual state of health yesterday.  Admitted to recent high salt intake.  At home, patient took 20 mg of Lasix p.o. without relief.    [facility] ED: Arrived in respiratory distress with RR 48, HR 122 (reported sinus on telemetry), BP 244/109, SPO2 90% on room air.  Initially placed on 2 L nasal cannula increased to 93%.  Concern for flash pulmonary edema secondary to hypertensive emergency.  Started on nitro drip, given Lasix 80 mg IV x1, nebulizer and Solu-Medrol for COPD.  Placed on BiPAP for respiratory distress.  Chest x-ray without significant pulmonary edema. He was reported to have responded well to this treatment. BP improved to 128/75 and taken off BiPAP to 2 L nasal cannula.  He was transferred to [facility] for further work-up.  Notable ED labs: HS troponin 16->38, no chest pain noted, creatinine 1.43, previous 1.53 2018, BNP 437, WBC 14, glucose 131.  Nursing Covid negative  Currently states he feels much better than this  morning.  States that he had time food last night for dinner with fried fish.  Since his last hospitalization in 2018 he has been trying to avoid and reduce his salt intake overall.  Reported 2 pound weight gain this morning (baseline 150 to 152 pounds, 154 pounds this morning prior to ED).  Denies chest pain/discomfort.  Denied wheezing, sick contacts.  Of note: Recent cardiology visit to establish care 08/18/2019 with Dr. Angelena Form: Patient asymptomatic.  Stopped aspirin, continued on Plavix, statin and beta-blocker at the time.  Noted patient discontinued ACE inhibitor due to hypotension in November 2020 prior to this appointment.  Review of Systems:  Review of Systems  Constitutional: Negative for chills and fever.  HENT: Negative for congestion and sore throat.   Eyes: Negative.   Respiratory: Positive for shortness of breath. Negative for cough.   Cardiovascular: Negative for chest pain, palpitations and leg swelling.  Gastrointestinal: Negative for nausea and vomiting.  Genitourinary: Negative.   Musculoskeletal: Negative.  Negative for falls.  Neurological: Negative.  Negative for headaches.  All other systems reviewed and are negative.    Medical/Social/Family History   Past Medical History: Past Medical History:  Diagnosis Date  . Atrial fibrillation ([place])   . CAD (coronary artery disease)    s/p CABG  . COPD (chronic obstructive pulmonary disease) ([place])   .  HTN (hypertension)   . Ischemic cardiomyopathy   . Mitral regurgitation   . Systolic CHF [facility])     Past Surgical History:  Procedure Laterality Date  . CORONARY ARTERY BYPASS GRAFT     4Vessel    Medications: Prior to Admission medications   Medication Sig Start Date End Date Taking? Authorizing Provider  carvedilol (COREG) 6.25 MG tablet Take 6.25 mg by mouth 2 (two) times daily. 07/20/16   [provider]  clopidogrel (PLAVIX) 75 MG tablet Take 75 mg by mouth daily. 07/15/16   [provider]    Dextromethorphan-guaiFENesin (CORICIDIN HBP CONGESTION/COUGH PO) Take 1 tablet by mouth as needed.    [provider]  furosemide (LASIX) 20 MG tablet Take 20 mg by mouth daily as needed.    [provider]  simvastatin (ZOCOR) 20 MG tablet Take 20 mg by mouth daily. 07/20/16   [provider]    Allergies:  No Known Allergies  Social History:  reports that he quit smoking about 22 years ago. His smoking use included cigarettes. He has a 50.00 pack-year smoking history. He has never used smokeless tobacco. He reports current alcohol use of about 7.0 standard drinks of alcohol per week. He reports that he does not use drugs.  Family History: Family History  Problem Relation Age of Onset  . Cancer Mother   . Heart attack Father 35     Objective   Physical Exam: Blood pressure 111/61, pulse 75, temperature 97.8 F (36.6 C), temperature source Oral, resp. rate 19, height 5\' 8"  (1.727 m), weight 70.7 kg, SpO2 100 %.  Physical Exam Vitals and nursing note reviewed.  Constitutional:      General: He is not in acute distress.    Appearance: Normal appearance.     Comments: Nasal cannula  HENT:     Head: Normocephalic and atraumatic.     Nose: Nose normal.     Mouth/Throat:     Mouth: Mucous membranes are moist.  Eyes:     Extraocular Movements: Extraocular movements intact.  Cardiovascular:     Rate and Rhythm: Normal rate and regular rhythm.     Comments: Mild JVD Pulmonary:     Effort: Pulmonary effort is normal.     Breath sounds: Normal breath sounds. No wheezing.  Abdominal:     General: Abdomen is flat.     Palpations: Abdomen is soft.  Musculoskeletal:        General: No swelling. Normal range of motion.     Cervical back: Normal range of motion. No rigidity.  Neurological:     General: No focal deficit present.     Mental Status: He is alert. Mental status is at baseline.  Psychiatric:        Mood and Affect: Mood normal.         Behavior: Behavior normal.     LABS on Admission: I have personally reviewed all the labs and imagings below    Basic Metabolic Panel: Recent Labs  Lab 09/08/19 0813  NA 139  K 4.4  CL 104  CO2 26  GLUCOSE 131*  BUN 24*  CREATININE 1.43*  CALCIUM 9.1   Liver Function Tests: Recent Labs  Lab 09/08/19 0813  AST 19  ALT 18  ALKPHOS 64  BILITOT 0.9  PROT 7.6  ALBUMIN 4.3   No results for input(s): LIPASE, AMYLASE in the last 168 hours. No results for input(s): AMMONIA in the last 168 hours. CBC: Recent Labs  Lab 09/08/19 0813  WBC 14.4*  NEUTROABS 11.7*  HGB 14.9  HCT 45.4  MCV 94.6  PLT 319   Cardiac Enzymes: No results for input(s): CKTOTAL, CKMB, CKMBINDEX, TROPONINI in the last 168 hours. BNP: Invalid input(s): POCBNP CBG: No results for input(s): GLUCAP in the last 168 hours.  Radiological Exams on Admission:  DG Chest Port 1 View  Result Date: 09/08/2019 CLINICAL DATA:  85 year old male with acute severe shortness of breath for 90 minutes. Former smoker. COVID-19. Status pending. EXAM: PORTABLE CHEST 1 VIEW COMPARISON:  Portable chest 12/29/2016.  Chest CT 04/20/2018. FINDINGS: Portable AP upright view at 0816 hours. Large lung volumes without significant cardiomegaly on the comparison CT, stable prominent cardiac silhouette likely artifact due to portable technique. Other mediastinal contours are within normal limits. Prior CABG. Visualized tracheal air column is within normal limits. Widespread bilateral pulmonary interstitial markings appear similar but less pronounced than on the 2016-12-17 portable chest. No evidence of pleural effusion or pneumothorax. No confluent opacity. No acute osseous abnormality identified. IMPRESSION: 1. No definite acute cardiopulmonary abnormality. Chronic pulmonary hyperinflation and widespread bilateral interstitial markings are probably chronic, and pulmonary vascularity appears normalized compared to a 12-17-2016 radiographs. 2. Prior  CABG, but no bona fide cardiomegaly suspected. Electronically Signed   By: Genevie Ann M.D.   On: 09/08/2019 08:50      EKG: Independently reviewed.  Pete EKG at bedside Regular rate, sinus rhythm with first-degree AV block, Q-wave in lead III, no pathologic ST changes    A & P   Active Problems:   Acute respiratory failure (HCC)   Hypertensive emergency   Hypertensive emergency BP 244/109 at [facility], currently 111/61 post nitro drip (currently off) and Lasix 80 mg IV x 1 Likely secondary to dietary indiscretion Symptomatically significantly improved BiPAP in ED, now on nasal cannula (no O2 requirement at baseline) Chest x-ray without pulmonary edema -Resume home Coreg -Telemetry  Acute on chronic systolic heart failure Secondary to dietary indiscretion and hypertensive emergency Improved with Lasix Off ACE inhibitor since November 2020 due to hypotension, only on Coreg Last EF 40 to 45% in 12/17/16 Recently established care with Dr. Angelena Form, [facility] -Heart failure protocol -Strict I&O and daily weights -Resume home Coreg -Low-salt diet -Echo -Telemetry -Follow-up volume status in a.m. and consider repeat Lasix dose  Elevated troponin Suspect demand ischemia in setting of above HS Troponin 16->38, without chest pain/discomfort Initial EKG difficult to read, repeat EKG without pathologic ST changes on personal read History of CAD status post CABG and DES Recently taken off aspirin and continued on Plavix monotherapy with statin and beta-blocker -Trend troponin -Telemetry -Echo -Continue Plavix, statin and beta-blocker -If troponin continues to trend upward, consider cardiology consult  Leukocytosis Suspect reactive vs steroid induced WBC 14 POC Covid negative Full PPE was used as patient was on COVID unit and on isolation -Follow-up Covid PCR, if negative he can be transferred to med telemetry out of Covid unit  COPD Not in acute exacerbation Received  Solu-Medrol and neb treatment in ED -Combivent every 6 hours as needed  A. fib/flutter status post ablation 2014-12-18 In sinus rhythm Not on anticoagulation -Continue Coreg  CAD status post CABG 12/18/11) and DES 2014/12/18) -Plan as above  PAD ABI 17-Dec-2012 0.76 on the left, 0.46 on the right. -Continue Plavix and statin -Continue outpatient work-up  Former tobacco user Stopped 1998  Mild mitral regurgitation 2016/12/17) -Echo  Hypertension  Now normotensive - Coreg   DVT prophylaxis: SCD  Code Status: Full Code  Diet: Heart Healthy Family Communication: Admission, patients condition and plan of care including tests being ordered have been discussed with the patient who indicates understanding and agrees with the plan and Code Status.  Daughter updated by phone Disposition Plan: The appropriate patient status for this patient is INPATIENT. Inpatient status is judged to be reasonable and necessary in order to provide the required intensity of service to ensure the patient's safety. The patient's presenting symptoms, physical exam findings, and initial radiographic and laboratory data in the context of their chronic comorbidities is felt to place them at high risk for further clinical deterioration. Furthermore, it is not anticipated that the patient will be medically stable for discharge from the hospital within 2 midnights of admission. The following factors support the patient status of inpatient.   " The patient's presenting symptoms include dyspnea requiring BiPAP. " The worrisome physical exam findings include significantly elevated blood pressure, JVD. " The initial radiographic and laboratory data are worrisome because of increased troponin, leukocytosis " The chronic co-morbidities include CAD status post CABG and DES, hypertension, A. fib/flutter status post ablation, tobacco use in the past, COPD.   * I certify that at the point of admission it is my clinical judgment that the patient will  require inpatient hospital care spanning beyond 2 midnights from the point of admission due to high intensity of service, high risk for further deterioration and high frequency of surveillance required.*    The medical decision making on this patient was of high complexity and the patient is at high risk for clinical deterioration, therefore this is a level 3  admission.  Consultants  . none  Procedures  . none  Time Spent on Admission: 75 minutes   Harold Hedge, DO [facility] 09/08/2019, 6:43 PM

## 2019-09-08 NOTE — ED Triage Notes (Signed)
Pt c/o sob x 1 hour. Pt denies CP, denies fever or cough at this time

## 2019-09-08 NOTE — ED Notes (Signed)
Sherry in lab notified to add on trop

## 2019-09-08 NOTE — ED Notes (Signed)
Spoke with Cassie at [facility], regarding transport to [facility]

## 2019-09-08 NOTE — ED Notes (Signed)
Pt removed from Bipap at this time. Placed on 3lpm [place]. Pt tolerating well. No distress noted. Pt pulled up in bed and comfortable.

## 2019-09-08 NOTE — Plan of Care (Signed)
Discussed with Dr. Bobby Rumpf transfer from [facility] Mr. Eisenbeis is a 85 year old male with pmh HTN, A. Fib s/p ablation not on a, systolic CHF, CAD s/p CABG, and COPD. Presents with shortness of breath this am.  Question if secondary to CHF and patient took additional 20 mg of Lasix p.o. without relief.  Initial blood pressures on arrival 244/109.  Placed on Bipap, but was weaned to [place] 2L O2.  Nitro drip was also started but able to be weaned off.  Labs significant for WBC 14.4, BUN 24, creatinine 1.43, BNP 436.8, troponin negative. formal covid testing pending.  He was given 80 mg of Lasix, 125 mg of Solu-Medrol, and breathing treatments.  Question CHF versus COPD versus hypertensive emergency as cause of symptoms.  Accepted to a cardiac telemetry bed.

## 2019-09-09 ENCOUNTER — Inpatient Hospital Stay (HOSPITAL_COMMUNITY): Payer: Medicare Other

## 2019-09-09 DIAGNOSIS — R0602 Shortness of breath: Secondary | ICD-10-CM

## 2019-09-09 DIAGNOSIS — Z951 Presence of aortocoronary bypass graft: Secondary | ICD-10-CM

## 2019-09-09 LAB — CBC
HCT: 37.9 % — ABNORMAL LOW (ref 39.0–52.0)
Hemoglobin: 12.9 g/dL — ABNORMAL LOW (ref 13.0–17.0)
MCH: 31.1 pg (ref 26.0–34.0)
MCHC: 34 g/dL (ref 30.0–36.0)
MCV: 91.3 fL (ref 80.0–100.0)
Platelets: 272 10*3/uL (ref 150–400)
RBC: 4.15 MIL/uL — ABNORMAL LOW (ref 4.22–5.81)
RDW: 12.9 % (ref 11.5–15.5)
WBC: 18.5 10*3/uL — ABNORMAL HIGH (ref 4.0–10.5)
nRBC: 0 % (ref 0.0–0.2)

## 2019-09-09 LAB — ECHOCARDIOGRAM COMPLETE
Height: 68 in
Weight: 2492.79 oz

## 2019-09-09 LAB — BASIC METABOLIC PANEL
Anion gap: 12 (ref 5–15)
BUN: 33 mg/dL — ABNORMAL HIGH (ref 8–23)
CO2: 22 mmol/L (ref 22–32)
Calcium: 8.8 mg/dL — ABNORMAL LOW (ref 8.9–10.3)
Chloride: 104 mmol/L (ref 98–111)
Creatinine, Ser: 1.66 mg/dL — ABNORMAL HIGH (ref 0.61–1.24)
GFR calc Af Amer: 43 mL/min — ABNORMAL LOW (ref >60)
GFR calc non Af Amer: 37 mL/min — ABNORMAL LOW (ref >60)
Glucose, Bld: 128 mg/dL — ABNORMAL HIGH (ref 70–99)
Potassium: 4.3 mmol/L (ref 3.5–5.1)
Sodium: 138 mmol/L (ref 135–145)

## 2019-09-09 LAB — MAGNESIUM: Magnesium: 2 mg/dL (ref 1.7–2.4)

## 2019-09-09 MED ORDER — ISOSORBIDE MONONITRATE ER 30 MG PO TB24
30.0000 mg | ORAL_TABLET | Freq: Every day | ORAL | Status: DC
  Administered 2019-09-09: 30 mg via ORAL
  Filled 2019-09-09: qty 1

## 2019-09-09 MED ORDER — ISOSORBIDE MONONITRATE ER 30 MG PO TB24: 30.0000 mg | ORAL_TABLET | Freq: Every day | ORAL | 0 refills | Status: DC

## 2019-09-09 NOTE — Progress Notes (Signed)
  Echocardiogram 2D Echocardiogram has been performed.  Dean Bean 09/09/2019, 12:29 PM

## 2019-09-09 NOTE — Discharge Summary (Signed)
[facility] Discharge Summary   Patient: Dean Bean ONG:[id number]   PCP: Harlan Stains, MD DOB: 1930/12/18   Date of admission: 09/08/2019   Date of discharge: 09/09/2019     Discharge Diagnoses:  Principal diagnosis Hypertensive urgency Acute on chronic diastolic and systolic CHF  Active Problems:   Acute respiratory failure ([place])   Hypertensive emergency   Hx of CABG 2013 [place]   Admitted From: Home Disposition:  Home   Recommendations for Outpatient Follow-up:  1. PCP: Follow-up with PCP in 1 week to adjust blood pressure 2. Follow up LABS/TEST:  none  Follow-up Information    Harlan Stains, MD. Schedule an appointment as soon as possible for a visit today.   Specialty: Family Medicine Contact information: [place] [address] [phone]          Diet recommendation: Cardiac diet  Activity: The patient is advised to gradually reintroduce usual activities,as tolerated  Discharge Condition: good  Code Status: Full code   History of present illness: As per the H and P dictated on admission, "This is an 85 year old male who was a direct admit from [facility] ED with history of hypertension, PAD, atrial fibrillation/flutter status post ablation (2013) not on anticoagulation, distant tobacco use, systolic heart failure (last EF 40 to 45% in 2018), CAD status post CABG (2013) and DES (2016), COPD not on home O2, who presented to [facility] a.m. 09/08/2019 for acute onset shortness of breath since this morning.  According to previous notes, patient denied chest pain, fever, cough and was in usual state of health yesterday.  Admitted to recent high salt intake.  At home, patient took 20 mg of Lasix p.o. without relief.  "  Hospital Course:  Summary of his active problems in the hospital is as following. Hypertensive emergency. Acute on chronic combined systolic and diastolic CHF. Acute respiratory distress. Elevated  troponin-demand ischemia Patient presents with complaints of shortness of breath.  Heart rate of 122 sinus tachycardia, systolic blood pressure about 200.  Patient was started on IV nitroglycerin drip on arrival.  Also was given IV Lasix. Due to patient's persistent respiratory distress patient was placed on BiPAP. With initial treatment he responded and improved to 2 L of nasal cannula. Patient continues to have some mild shortness of breath as well as hypoxia and therefore was referred for admission. Echocardiogram shows evidence of unchanged EF 45% from prior with unchanged wall motion abnormality. No significant valvular abnormality. Imdur was added for hypertension. Patient was continued on his home regimen. Patient significant improvement in his condition from severe respiratory distress, hypoxia and hypertensive urgency at the time of admission.  At present patient felt safe to be discharged home with close monitoring and follow-up with PCP as well as cardiology.  Leukocytosis Suspect reactive vs steroid induced  COPD Not in acute exacerbation Received Solu-Medrol and neb treatment in ED  A. fib/flutter status post ablation 2016 In sinus rhythm Not on anticoagulation -Continue Coreg  CAD status post CABG (2013) and DES (2016) -Plan as above  PAD ABI 2014 0.76 on the left, 0.46 on the right. -Continue Plavix and statin -Continue outpatient work-up  Former tobacco user [place]  Mild mitral regurgitation (2018)  Active Problems:   Acute respiratory failure ([place])   Hypertensive emergency   Hx of CABG 2013 [place]    Patient was ambulatory without any assistance. On the day of the discharge the patient's vitals were stable, and no other  acute medical condition were reported by patient. the patient was felt safe to be discharge at Home with no therapy needed on discharge.  Consultants: none Procedures: Echocardiogram   DISCHARGE  MEDICATION: Allergies as of 09/09/2019   No Known Allergies     Medication List    TAKE these medications   carvedilol 6.25 MG tablet Commonly known as: COREG Take 6.25 mg by mouth 2 (two) times daily.   clopidogrel 75 MG tablet Commonly known as: PLAVIX Take 75 mg by mouth daily.   CORICIDIN HBP CONGESTION/COUGH PO Take 1 tablet by mouth as needed.   furosemide 20 MG tablet Commonly known as: LASIX Take 20 mg by mouth daily as needed.   isosorbide mononitrate 30 MG 24 hr tablet Commonly known as: IMDUR Take 1 tablet (30 mg total) by mouth daily. Start taking on: September 10, 2019   simvastatin 20 MG tablet Commonly known as: ZOCOR Take 20 mg by mouth daily.      No Known Allergies Discharge Instructions    Diet - low sodium heart healthy   Complete by: As directed    Increase activity slowly   Complete by: As directed      Discharge Exam: Filed Weights   09/08/19 0941  Weight: 70.7 kg   Vitals:   09/09/19 0300 09/09/19 0923  BP: (!) 119/53 (!) 170/66  Pulse: (!) 58 64  Resp: 16   Temp: 97.8 F (36.6 C) 98.1 F (36.7 C)  SpO2: 97% 99%   General: Appear in no distress, no Rash; Oral Mucosa Clear, moist. no Abnormal Mass Or lumps Cardiovascular: S1 and S2 Present, no Murmur, Respiratory: normal respiratory effort, Bilateral Air entry present and Clear to Auscultation, no Crackles, no wheezes Abdomen: Bowel Sound present, Soft and no tenderness, no hernia Extremities: no Pedal edema, no calf tenderness Neurology: alert and oriented to time, place, and person affect appropriate.  The results of significant diagnostics from this hospitalization (including imaging, microbiology, ancillary and laboratory) are listed below for reference.    Significant Diagnostic Studies: DG Chest Port 1 View  Result Date: 09/08/2019 CLINICAL DATA:  85 year old male with acute severe shortness of breath for 90 minutes. Former smoker. COVID-19. Status pending. EXAM:  PORTABLE CHEST 1 VIEW COMPARISON:  Portable chest 12/29/2016.  Chest CT 04/20/2018. FINDINGS: Portable AP upright view at 0816 hours. Large lung volumes without significant cardiomegaly on the comparison CT, stable prominent cardiac silhouette likely artifact due to portable technique. Other mediastinal contours are within normal limits. Prior CABG. Visualized tracheal air column is within normal limits. Widespread bilateral pulmonary interstitial markings appear similar but less pronounced than on the 11/22/16 portable chest. No evidence of pleural effusion or pneumothorax. No confluent opacity. No acute osseous abnormality identified. IMPRESSION: 1. No definite acute cardiopulmonary abnormality. Chronic pulmonary hyperinflation and widespread bilateral interstitial markings are probably chronic, and pulmonary vascularity appears normalized compared to a 2016-11-22 radiographs. 2. Prior CABG, but no bona fide cardiomegaly suspected. Electronically Signed   By: Genevie Ann M.D.   On: 09/08/2019 08:50   ECHOCARDIOGRAM COMPLETE  Result Date: 09/09/2019   ECHOCARDIOGRAM REPORT   Patient Name:   Dean Bean Date of Exam: 09/09/2019 Medical Rec #:  [id number]         Height:       68.0 in Accession #:    [id number]        Weight:       155.8 lb Date of Birth:  Oct 04, 1930  BSA:          1.84 m Patient Age:    53 years          BP:           170/66 mmHg Patient Gender: M                 HR:           64 bpm. Exam Location:  Inpatient Procedure: 2D Echo Indications:    Dyspnea 786.09/R06.00  History:        Patient has prior history of Echocardiogram examinations, most                 recent 01/10/2017. CAD, Prior CABG, COPD, Arrythmias:Atrial                 Fibrillation; Risk Factors:Hypertension. S/p ablation. Ischemic                 cardiomyopathy.  Sonographer:    Clayton Lefort RDCS (AE) Referring Phys: [id number] [place]  1. Left ventricular ejection fraction, by visual estimation, is 45 to 50%. The  left ventricle has mildly decreased function. There is mildly increased left ventricular hypertrophy.  2. Basal and mid inferior septum and basal and mid inferior wall are abnormal.  3. Left ventricular diastolic parameters are consistent with Grade II diastolic dysfunction (pseudonormalization).  4. The left ventricle demonstrates regional wall motion abnormalities.  5. Global right ventricle has normal systolic function.The right ventricular size is normal. No increase in right ventricular wall thickness.  6. Left atrial size was normal.  7. Right atrial size was normal.  8. The mitral valve is normal in structure. Mild mitral valve regurgitation. No evidence of mitral stenosis.  9. The tricuspid valve is normal in structure. 10. The aortic valve is normal in structure. Aortic valve regurgitation is not visualized. No evidence of aortic valve sclerosis or stenosis. 11. The pulmonic valve was normal in structure. Pulmonic valve regurgitation is not visualized. 12. The inferior vena cava is normal in size with greater than 50% respiratory variability, suggesting right atrial pressure of 3 mmHg. FINDINGS  Left Ventricle: Left ventricular ejection fraction, by visual estimation, is 45 to 50%. The left ventricle has mildly decreased function. The left ventricle demonstrates regional wall motion abnormalities. There is mildly increased left ventricular hypertrophy. Left ventricular diastolic parameters are consistent with Grade II diastolic dysfunction (pseudonormalization). Normal left atrial pressure.  LV Wall Scoring: The basal and mid inferior septum and basal and mid inferior wall are akinetic. Right Ventricle: The right ventricular size is normal. No increase in right ventricular wall thickness. Global RV systolic function is has normal systolic function. Left Atrium: Left atrial size was normal in size. Right Atrium: Right atrial size was normal in size Pericardium: There is no evidence of pericardial effusion.  Mitral Valve: The mitral valve is normal in structure. Mild mitral valve regurgitation. No evidence of mitral valve stenosis by observation. Tricuspid Valve: The tricuspid valve is normal in structure. Tricuspid valve regurgitation is not demonstrated. Aortic Valve: The aortic valve is normal in structure. Aortic valve regurgitation is not visualized. The aortic valve is structurally normal, with no evidence of sclerosis or stenosis. Aortic valve mean gradient measures 2.0 mmHg. Aortic valve peak gradient measures 4.3 mmHg. Aortic valve area, by VTI measures 2.35 cm. Pulmonic Valve: The pulmonic valve was normal in structure. Pulmonic valve regurgitation is not visualized. Pulmonic regurgitation is not visualized. Aorta: The aortic root,  ascending aorta and aortic arch are all structurally normal, with no evidence of dilitation or obstruction. Venous: The inferior vena cava is normal in size with greater than 50% respiratory variability, suggesting right atrial pressure of 3 mmHg. IAS/Shunts: No atrial level shunt detected by color flow Doppler. There is no evidence of a patent foramen ovale. No ventricular septal defect is seen or detected. There is no evidence of an atrial septal defect.  LEFT VENTRICLE PLAX 2D LVIDd:         4.50 cm       Diastology LVIDs:         3.60 cm       LV e' lateral:   9.08 cm/s LV PW:         1.40 cm       LV E/e' lateral: 7.4 LV IVS:        1.30 cm       LV e' medial:    3.90 cm/s LVOT diam:     2.00 cm       LV E/e' medial:  17.3 LV SV:         38 ml LV SV Index:   20.59 LVOT Area:     3.14 cm  LV Volumes (MOD) LV area d, A2C:    37.50 cm LV area d, A4C:    40.40 cm LV area s, A2C:    25.40 cm LV area s, A4C:    26.40 cm LV major d, A2C:   8.86 cm LV major d, A4C:   8.76 cm LV major s, A2C:   7.60 cm LV major s, A4C:   7.87 cm LV vol d, MOD A2C: 133.0 ml LV vol d, MOD A4C: 159.0 ml LV vol s, MOD A2C: 71.7 ml LV vol s, MOD A4C: 74.2 ml LV SV MOD A2C:     61.3 ml LV SV MOD A4C:      159.0 ml LV SV MOD BP:      71.5 ml RIGHT VENTRICLE RV S prime:     8.33 cm/s TAPSE (M-mode): 1.3 cm LEFT ATRIUM             Index       RIGHT ATRIUM           Index LA diam:        4.30 cm 2.34 cm/m  RA Area:     18.60 cm LA Vol (A2C):   62.7 ml 34.11 ml/m RA Volume:   51.40 ml  27.97 ml/m LA Vol (A4C):   66.0 ml 35.91 ml/m LA Biplane Vol: 69.0 ml 37.54 ml/m  AORTIC VALVE AV Area (Vmax):    2.10 cm AV Area (Vmean):   2.17 cm AV Area (VTI):     2.35 cm AV Vmax:           104.00 cm/s AV Vmean:          65.767 cm/s AV VTI:            0.221 m AV Peak Grad:      4.3 mmHg AV Mean Grad:      2.0 mmHg LVOT Vmax:         69.58 cm/s LVOT Vmean:        45.340 cm/s LVOT VTI:          0.165 m LVOT/AV VTI ratio: 0.75  AORTA Ao Root diam: 2.90 cm MITRAL VALVE MV Area (PHT): 2.66 cm  SHUNTS MV PHT:        82.65 msec           Systemic VTI:  0.16 m MV Decel Time: 285 msec             Systemic Diam: 2.00 cm MR Peak grad:    145.4 mmHg MR Mean grad:    86.0 mmHg MR Vmax:         603.00 cm/s MR Vmean:        428.0 cm/s MR PISA:         1.57 cm MR PISA Eff ROA: 7 mm MR PISA Radius:  0.50 cm MV E velocity: 67.30 cm/s 103 cm/s MV A velocity: 60.40 cm/s 70.3 cm/s MV E/A ratio:  1.11       1.5  Candee Furbish MD Electronically signed by Candee Furbish MD Signature Date/Time: 09/09/2019/12:37:27 PM    Final    Microbiology: Recent Results (from the past 240 hour(s))  SARS Coronavirus 2 Ag (30 min TAT) - Nasal Swab (BD Veritor Kit)     Status: None   Collection Time: 09/08/19  8:27 AM   Specimen: Nasal Swab (BD Veritor Kit)  Result Value Ref Range Status   SARS Coronavirus 2 Ag NEGATIVE NEGATIVE Final    Comment: (NOTE) SARS-CoV-2 antigen NOT DETECTED.  Negative results are presumptive.  Negative results do not preclude SARS-CoV-2 infection and should not be used as the sole basis for treatment or other patient management decisions, including infection  control decisions, particularly in the presence of  clinical signs and  symptoms consistent with COVID-19, or in those who have been in contact with the virus.  Negative results must be combined with clinical observations, patient history, and epidemiological information. The expected result is Negative. Fact Sheet for Patients: PodPark.tn Fact Sheet for Healthcare Providers: GiftContent.is This test is not yet approved or cleared by the [place] FDA and  has been authorized for detection and/or diagnosis of SARS-CoV-2 by FDA under an Emergency Use Authorization (EUA).  This EUA will remain in effect (meaning this test can be used) for the duration of  the COVID-19 de claration under Section 564(b)(1) of the Act, 21 U.S.C. section 360bbb-3(b)(1), unless the authorization is terminated or revoked sooner. Performed at [facility] [address] [place], [place] [address]   SARS CORONAVIRUS 2 (TAT 6-24 HRS) Nasopharyngeal Nasopharyngeal Swab     Status: None   Collection Time: 09/08/19  9:15 AM   Specimen: Nasopharyngeal Swab  Result Value Ref Range Status   SARS Coronavirus 2 NEGATIVE NEGATIVE Final    Comment: (NOTE) SARS-CoV-2 target nucleic acids are NOT DETECTED. The SARS-CoV-2 RNA is generally detectable in upper and lower respiratory specimens during the acute phase of infection. Negative results do not preclude SARS-CoV-2 infection, do not rule out co-infections with other pathogens, and should not be used as the sole basis for treatment or other patient management decisions. Negative results must be combined with clinical observations, patient history, and epidemiological information. The expected result is Negative. Fact Sheet for Patients: SugarRoll.be Fact Sheet for Healthcare Providers: https://www.woods-mathews.com/ This test is not yet approved or cleared by the [place] FDA and  has been  authorized for detection and/or diagnosis of SARS-CoV-2 by FDA under an Emergency Use Authorization (EUA). This EUA will remain  in effect (meaning this test can be used) for the duration of the COVID-19 declaration under Section 56 4(b)(1) of the Act, 21 U.S.C. section 360bbb-3(b)(1), unless  the authorization is terminated or revoked sooner. Performed at [facility], [place] [address], [place], [place] [address]      Labs: CBC: Recent Labs  Lab 09/08/19 0813 09/09/19 0258  WBC 14.4* 18.5*  NEUTROABS 11.7*  --   HGB 14.9 12.9*  HCT 45.4 37.9*  MCV 94.6 91.3  PLT 319 143   Basic Metabolic Panel: Recent Labs  Lab 09/08/19 0813 09/09/19 0258  NA 139 138  K 4.4 4.3  CL 104 104  CO2 26 22  GLUCOSE 131* 128*  BUN 24* 33*  CREATININE 1.43* 1.66*  CALCIUM 9.1 8.8*  MG  --  2.0   Liver Function Tests: Recent Labs  Lab 09/08/19 0813  AST 19  ALT 18  ALKPHOS 64  BILITOT 0.9  PROT 7.6  ALBUMIN 4.3   No results for input(s): LIPASE, AMYLASE in the last 168 hours. No results for input(s): AMMONIA in the last 168 hours. Cardiac Enzymes: No results for input(s): CKTOTAL, CKMB, CKMBINDEX, TROPONINI in the last 168 hours. BNP (last 3 results) Recent Labs    09/08/19 0813  BNP 436.8*   CBG: No results for input(s): GLUCAP in the last 168 hours.  Time spent: 35 minutes  Signed:  Berle Mull  [facility] 09/09/2019 6:49 PM

## 2019-09-18 DIAGNOSIS — N1831 Chronic kidney disease, stage 3a: Secondary | ICD-10-CM | POA: Diagnosis not present

## 2019-09-18 DIAGNOSIS — Z23 Encounter for immunization: Secondary | ICD-10-CM | POA: Diagnosis not present

## 2019-09-18 DIAGNOSIS — I739 Peripheral vascular disease, unspecified: Secondary | ICD-10-CM | POA: Diagnosis not present

## 2019-09-18 DIAGNOSIS — I2510 Atherosclerotic heart disease of native coronary artery without angina pectoris: Secondary | ICD-10-CM | POA: Diagnosis not present

## 2019-09-18 DIAGNOSIS — I129 Hypertensive chronic kidney disease with stage 1 through stage 4 chronic kidney disease, or unspecified chronic kidney disease: Secondary | ICD-10-CM | POA: Diagnosis not present

## 2019-09-18 DIAGNOSIS — I5023 Acute on chronic systolic (congestive) heart failure: Secondary | ICD-10-CM | POA: Diagnosis not present

## 2019-09-18 DIAGNOSIS — D72829 Elevated white blood cell count, unspecified: Secondary | ICD-10-CM | POA: Diagnosis not present

## 2019-09-24 NOTE — Progress Notes (Signed)
Evaluation Performed:  Follow-up visit  Date:  09/25/2019   ID:  Dean Bean, DOB [birth year], MRN [id number]  Chief Complaint:   Chief Complaint  Patient presents with  . Follow-up    CAD   History of Present Illness:    Dean Bean is a 86 y.o. male with history of CKD, CAD s/p 4V CABG in 2013, atrial flutter/fib s/p ablation 2013, ischemic cardiomyopathy, chronic systolic CHF and COPD who is here today for cardiac follow up. I met him as a new patient to establish cardiology care on 10/02/16. He had been followed in [place] by Dr. Rozetta Nunnery but moved to [facility] at the end of 2017. Cardiac history includes 4V CABG in November 2013 (LIMA to LAD, SVG to OM, SVG to PDA, SVG to Diagonal) and stenting of the left main artery in 2016 (DES placed from left main into the diagonal not protected by LIMA graft). He had an atrial flutter ablation in 2013. He had an echo in 2016 with normal LVEF and mild MR. He is also known to have PAD. He was told he had blockages in his legs and was seen by vascular surgery but he says they said there was nothing to worry about. Former smoker (50 pack years, stopped smoking in 1998). He was admitted to [facility] April 2018 with dyspnea and was felt to be volume overloaded. He was diuresed with IV Lasix and had rapid improvement in his dyspnea. Echo 12/21/16 with LVEF=40-45% with mild MR. He was admitted to [facility] 09/08/19 with dyspnea, hypertensive emergency. He was diuresed for mild volume overload and started on Imdur. Echo 09/09/19 with LVEF=45-50%, unchanged. Mild MR.   He is here today for follow up. The patient denies any chest pain, dyspnea, palpitations, lower extremity edema, orthopnea, PND, dizziness, near syncope or syncope. Feeling well since discharge.   Primary Care Physician: Harlan Stains, MD   Past Medical History:  Diagnosis Date  . Atrial fibrillation ([place])   . CAD (coronary artery disease)    s/p CABG  . COPD  (chronic obstructive pulmonary disease) ([place])   . HTN (hypertension)   . Ischemic cardiomyopathy   . Mitral regurgitation   . Systolic CHF [facility])    Past Surgical History:  Procedure Laterality Date  . CORONARY ARTERY BYPASS GRAFT     4Vessel     Current Meds  Medication Sig  . carvedilol (COREG) 6.25 MG tablet Take 6.25 mg by mouth 2 (two) times daily.  . clopidogrel (PLAVIX) 75 MG tablet Take 75 mg by mouth daily.  Marland Kitchen Dextromethorphan-guaiFENesin (CORICIDIN HBP CONGESTION/COUGH PO) Take 1 tablet by mouth as needed.  . furosemide (LASIX) 20 MG tablet Take 20 mg by mouth daily as needed.  . simvastatin (ZOCOR) 20 MG tablet Take 20 mg by mouth daily.     Allergies:   Patient has no known allergies.   Social History   Tobacco Use  . Smoking status: Former Smoker    Packs/day: 1.00    Years: 50.00    Pack years: 50.00    Types: Cigarettes    Quit date: 10/02/1996    Years since quitting: 22.9  . Smokeless tobacco: Never Used  Substance Use Topics  . Alcohol use: Yes    Alcohol/week: 7.0 standard drinks    Types: 7 Glasses of wine per week  . Drug use: No     Family Hx: The patient's family history includes Cancer in his mother; Heart attack (age of onset: 58) in  his father.  ROS:   Please see the history of present illness.    All other systems reviewed and are negative.   Prior CV studies:   The following studies were reviewed today:  Echo December 2020:   1. Left ventricular ejection fraction, by visual estimation, is 45 to 50%. The left ventricle has mildly decreased function. There is mildly increased left ventricular hypertrophy.  2. Basal and mid inferior septum and basal and mid inferior wall are abnormal.  3. Left ventricular diastolic parameters are consistent with Grade II diastolic dysfunction (pseudonormalization).  4. The left ventricle demonstrates regional wall motion abnormalities.  5. Global right ventricle has normal systolic function.The right  ventricular size is normal. No increase in right ventricular wall thickness.  6. Left atrial size was normal.  7. Right atrial size was normal.  8. The mitral valve is normal in structure. Mild mitral valve regurgitation. No evidence of mitral stenosis.  9. The tricuspid valve is normal in structure. 10. The aortic valve is normal in structure. Aortic valve regurgitation is not visualized. No evidence of aortic valve sclerosis or stenosis. 11. The pulmonic valve was normal in structure. Pulmonic valve regurgitation is not visualized. 12. The inferior vena cava is normal in size with greater than 50% respiratory variability, suggesting right atrial pressure of 3 mmHg.  Labs/Other Tests and Data Reviewed:    EKG:  The EKG is not performed today The EKG is personally reviewed by me and shows   Recent Labs: 09/08/2019: ALT 18; B Natriuretic Peptide 436.8 09/09/2019: BUN 33; Creatinine, Ser 1.66; Hemoglobin 12.9; Magnesium 2.0; Platelets 272; Potassium 4.3; Sodium 138   Recent Lipid Panel No results found for: CHOL, TRIG, HDL, CHOLHDL, LDLCALC, LDLDIRECT  Wt Readings from Last 3 Encounters:  09/25/19 151 lb (68.5 kg)  09/08/19 155 lb 12.8 oz (70.7 kg)  08/18/19 155 lb 12.8 oz (70.7 kg)     Objective:    Vital Signs:  BP (!) 144/76   Pulse 77   Ht '5\' 9"'  (1.753 m)   Wt 151 lb (68.5 kg)   SpO2 99%   BMI 22.30 kg/m    General: Well developed, well nourished, NAD  HEENT: OP clear, mucus membranes moist  SKIN: warm, dry. No rashes. Neuro: No focal deficits  Musculoskeletal: Muscle strength 5/5 all ext  Psychiatric: Mood and affect normal  Neck: No JVD, no carotid bruits, no thyromegaly, no lymphadenopathy.  Lungs:Clear bilaterally, no wheezes, rhonci, crackles Cardiovascular: Regular rate and rhythm. No murmurs, gallops or rubs. Abdomen:Soft. Bowel sounds present. Non-tender.  Extremities: No lower extremity edema. Pulses are 2 + in the bilateral DP/PT.   ASSESSMENT & PLAN:      1. CAD s/p CABG without angina: He had CABG in 2013 and then had stenting of his left main into the diagonal branch in 2016. This branch was unprotected by the grafts. He has no chest pain. Continue Plavix, statin and beta blocker.         2. Ischemic cardiomyopathy: Mild LV systolic dysfunction by echo in December 2020 with LVEF=45%. Continue beta blocker and Imdur. He did not tolerate Ace-inh due to hypotension. (stopped in November 2020).    3. Chronic systolic CHF:  Weight is stable. No evidence of volume overload. Continue Lasix as needed.   4. PAD: He has known PAD by non-invasive studies at [facility] in 2014 with ABI 0.76 on the left and 0.46 on the right. We have spoken in the past about repeating ABI  testing here but he has wished to delay. He will let us know if he has pain in his legs.    5. Atrial fibrillation/flutter: He is s/p ablation. He has not been on long term anticoagulation since he has has no recurrence of his AF following ablation. No palpitations. Continue beta blocker.   6. Former tobacco abuse: He stopped smoking in 1998.   7. Mitral regurgitation: Mild by echo December 2020.   8. HTN: BP is well controlled in the 120s-130s at home  Medication Adjustments/Labs and Tests Ordered: Current medicines are reviewed at length with the patient today.  Concerns regarding medicines are outlined above.   Tests Ordered: No orders of the defined types were placed in this encounter.   Medication Changes: No orders of the defined types were placed in this encounter.   Disposition:  Follow up in 6 month(s)  Signed, Lauree Chandler, MD  09/25/2019 11:02 AM    [employer]

## 2019-09-25 ENCOUNTER — Ambulatory Visit (INDEPENDENT_AMBULATORY_CARE_PROVIDER_SITE_OTHER): Payer: Medicare Other | Admitting: Cardiovascular Disease

## 2019-09-25 ENCOUNTER — Other Ambulatory Visit: Payer: Self-pay

## 2019-09-25 ENCOUNTER — Encounter: Payer: Self-pay | Admitting: Cardiovascular Disease

## 2019-09-25 VITALS — BP 144/76 | HR 77 | Ht 69.0 in | Wt 151.0 lb

## 2019-09-25 DIAGNOSIS — I340 Nonrheumatic mitral (valve) insufficiency: Secondary | ICD-10-CM

## 2019-09-25 DIAGNOSIS — I2510 Atherosclerotic heart disease of native coronary artery without angina pectoris: Secondary | ICD-10-CM

## 2019-09-25 DIAGNOSIS — I5022 Chronic systolic (congestive) heart failure: Secondary | ICD-10-CM | POA: Diagnosis not present

## 2019-09-25 DIAGNOSIS — I10 Essential (primary) hypertension: Secondary | ICD-10-CM

## 2019-09-25 DIAGNOSIS — I480 Paroxysmal atrial fibrillation: Secondary | ICD-10-CM

## 2019-09-25 DIAGNOSIS — I739 Peripheral vascular disease, unspecified: Secondary | ICD-10-CM | POA: Diagnosis not present

## 2019-09-25 DIAGNOSIS — I255 Ischemic cardiomyopathy: Secondary | ICD-10-CM

## 2019-09-25 NOTE — Patient Instructions (Signed)
Medication Instructions:  No changes (IMDUR removed from your medication list) *If you need a refill on your cardiac medications before your next appointment, please call your pharmacy*  Lab Work: none If you have labs (blood work) drawn today and your tests are completely normal, you will receive your results only by: Marland Kitchen MyChart Message (if you have MyChart) OR . A paper copy in the mail If you have any lab test that is abnormal or we need to change your treatment, we will call you to review the results.  Testing/Procedures: none  Follow-Up: At [facility], you and your health needs are our priority.  As part of our continuing mission to provide you with exceptional heart care, we have created designated Provider Care Teams.  These Care Teams include your primary Cardiologist (physician) and Advanced Practice Providers (APPs -  Physician Assistants and Nurse Practitioners) who all work together to provide you with the care you need, when you need it.  Your next appointment:   6 month(s)  The format for your next appointment:   Either In Person or Virtual  Provider:   You may see Lauree Chandler, MD or one of the following Advanced Practice Providers on your designated Care Team:    Melina Copa, PA-C  Ermalinda Barrios, PA-C   Other Instructions

## 2019-10-03 DIAGNOSIS — Z20822 Contact with and (suspected) exposure to covid-19: Secondary | ICD-10-CM | POA: Diagnosis not present

## 2019-10-12 ENCOUNTER — Ambulatory Visit: Payer: Medicare Other

## 2019-10-19 ENCOUNTER — Ambulatory Visit: Payer: Medicare Other | Attending: Internal Medicine

## 2019-10-19 ENCOUNTER — Ambulatory Visit: Payer: Medicare Other

## 2019-10-19 DIAGNOSIS — Z23 Encounter for immunization: Secondary | ICD-10-CM | POA: Insufficient documentation

## 2019-10-19 NOTE — Progress Notes (Signed)
   Covid-19 Vaccination Clinic  Name:  Dean Bean    MRN: [id number] DOB: 1931-05-09  10/19/2019  Mr. Zara was observed post Covid-19 immunization for 15 minutes without incidence. He was provided with Vaccine Information Sheet and instruction to access the V-Safe system.   Mr. Gerrits was instructed to call 911 with any severe reactions post vaccine: Marland Kitchen Difficulty breathing  . Swelling of your face and throat  . A fast heartbeat  . A bad rash all over your body  . Dizziness and weakness    Immunizations Administered    Name Date Dose VIS Date Route   Pfizer COVID-19 Vaccine 10/19/2019  3:09 PM 0.3 mL 08/25/2019 Intramuscular   Manufacturer: [place]   Lot: [id number]   [place]: [id number]

## 2019-10-23 ENCOUNTER — Ambulatory Visit: Payer: Medicare Other

## 2019-11-13 ENCOUNTER — Ambulatory Visit: Payer: Medicare Other | Attending: Internal Medicine

## 2019-11-13 DIAGNOSIS — Z23 Encounter for immunization: Secondary | ICD-10-CM | POA: Insufficient documentation

## 2019-11-13 NOTE — Progress Notes (Signed)
   Covid-19 Vaccination Clinic  Name:  Dean Bean    MRN: [id number] DOB: Sep 11, 1931  11/13/2019  [name] was observed post Covid-19 immunization for 15 minutes without incidence. He was provided with Vaccine Information Sheet and instruction to access the V-Safe system.   [name] was instructed to call 911 with any severe reactions post vaccine: Marland Kitchen Difficulty breathing  . Swelling of your face and throat  . A fast heartbeat  . A bad rash all over your body  . Dizziness and weakness    Immunizations Administered    Name Date Dose VIS Date Route   Pfizer COVID-19 Vaccine 11/13/2019  5:16 PM 0.3 mL 08/25/2019 Intramuscular   Manufacturer: [place]   Lot: [id number]   [place]: [id number]

## 2019-12-22 DIAGNOSIS — K625 Hemorrhage of anus and rectum: Secondary | ICD-10-CM | POA: Diagnosis not present

## 2019-12-22 DIAGNOSIS — I10 Essential (primary) hypertension: Secondary | ICD-10-CM | POA: Diagnosis not present

## 2019-12-22 DIAGNOSIS — C20 Malignant neoplasm of rectum: Secondary | ICD-10-CM | POA: Diagnosis not present

## 2019-12-22 DIAGNOSIS — R531 Weakness: Secondary | ICD-10-CM | POA: Diagnosis not present

## 2019-12-22 DIAGNOSIS — K922 Gastrointestinal hemorrhage, unspecified: Secondary | ICD-10-CM | POA: Diagnosis not present

## 2019-12-22 DIAGNOSIS — D6832 Hemorrhagic disorder due to extrinsic circulating anticoagulants: Secondary | ICD-10-CM | POA: Diagnosis not present

## 2019-12-22 DIAGNOSIS — R9431 Abnormal electrocardiogram [ECG] [EKG]: Secondary | ICD-10-CM | POA: Diagnosis not present

## 2019-12-22 DIAGNOSIS — D62 Acute posthemorrhagic anemia: Secondary | ICD-10-CM | POA: Diagnosis not present

## 2019-12-22 DIAGNOSIS — Z6821 Body mass index (BMI) 21.0-21.9, adult: Secondary | ICD-10-CM | POA: Diagnosis not present

## 2019-12-22 DIAGNOSIS — N179 Acute kidney failure, unspecified: Secondary | ICD-10-CM | POA: Diagnosis not present

## 2019-12-22 DIAGNOSIS — I2510 Atherosclerotic heart disease of native coronary artery without angina pectoris: Secondary | ICD-10-CM | POA: Diagnosis not present

## 2019-12-22 DIAGNOSIS — C187 Malignant neoplasm of sigmoid colon: Secondary | ICD-10-CM | POA: Diagnosis not present

## 2019-12-22 DIAGNOSIS — Z20822 Contact with and (suspected) exposure to covid-19: Secondary | ICD-10-CM | POA: Diagnosis not present

## 2019-12-22 DIAGNOSIS — Z951 Presence of aortocoronary bypass graft: Secondary | ICD-10-CM | POA: Diagnosis not present

## 2019-12-23 DIAGNOSIS — D62 Acute posthemorrhagic anemia: Secondary | ICD-10-CM | POA: Diagnosis not present

## 2019-12-23 DIAGNOSIS — I2510 Atherosclerotic heart disease of native coronary artery without angina pectoris: Secondary | ICD-10-CM | POA: Diagnosis not present

## 2019-12-23 DIAGNOSIS — I10 Essential (primary) hypertension: Secondary | ICD-10-CM | POA: Diagnosis not present

## 2019-12-23 DIAGNOSIS — K921 Melena: Secondary | ICD-10-CM | POA: Diagnosis not present

## 2019-12-23 DIAGNOSIS — K922 Gastrointestinal hemorrhage, unspecified: Secondary | ICD-10-CM | POA: Diagnosis not present

## 2019-12-23 DIAGNOSIS — Z951 Presence of aortocoronary bypass graft: Secondary | ICD-10-CM | POA: Diagnosis not present

## 2019-12-24 DIAGNOSIS — C20 Malignant neoplasm of rectum: Secondary | ICD-10-CM | POA: Diagnosis present

## 2019-12-24 DIAGNOSIS — R911 Solitary pulmonary nodule: Secondary | ICD-10-CM | POA: Diagnosis not present

## 2019-12-24 DIAGNOSIS — I2510 Atherosclerotic heart disease of native coronary artery without angina pectoris: Secondary | ICD-10-CM | POA: Diagnosis present

## 2019-12-24 DIAGNOSIS — D62 Acute posthemorrhagic anemia: Secondary | ICD-10-CM | POA: Diagnosis present

## 2019-12-24 DIAGNOSIS — D128 Benign neoplasm of rectum: Secondary | ICD-10-CM | POA: Diagnosis not present

## 2019-12-24 DIAGNOSIS — N400 Enlarged prostate without lower urinary tract symptoms: Secondary | ICD-10-CM | POA: Diagnosis present

## 2019-12-24 DIAGNOSIS — J432 Centrilobular emphysema: Secondary | ICD-10-CM | POA: Diagnosis not present

## 2019-12-24 DIAGNOSIS — Z955 Presence of coronary angioplasty implant and graft: Secondary | ICD-10-CM | POA: Diagnosis not present

## 2019-12-24 DIAGNOSIS — Z66 Do not resuscitate: Secondary | ICD-10-CM | POA: Diagnosis present

## 2019-12-24 DIAGNOSIS — C189 Malignant neoplasm of colon, unspecified: Secondary | ICD-10-CM | POA: Diagnosis not present

## 2019-12-24 DIAGNOSIS — Z79899 Other long term (current) drug therapy: Secondary | ICD-10-CM | POA: Diagnosis not present

## 2019-12-24 DIAGNOSIS — K5520 Angiodysplasia of colon without hemorrhage: Secondary | ICD-10-CM | POA: Diagnosis present

## 2019-12-24 DIAGNOSIS — Z7902 Long term (current) use of antithrombotics/antiplatelets: Secondary | ICD-10-CM | POA: Diagnosis not present

## 2019-12-24 DIAGNOSIS — K626 Ulcer of anus and rectum: Secondary | ICD-10-CM | POA: Diagnosis not present

## 2019-12-24 DIAGNOSIS — N141 Nephropathy induced by other drugs, medicaments and biological substances: Secondary | ICD-10-CM | POA: Diagnosis not present

## 2019-12-24 DIAGNOSIS — N179 Acute kidney failure, unspecified: Secondary | ICD-10-CM | POA: Diagnosis not present

## 2019-12-24 DIAGNOSIS — E7800 Pure hypercholesterolemia, unspecified: Secondary | ICD-10-CM | POA: Diagnosis present

## 2019-12-24 DIAGNOSIS — Z20822 Contact with and (suspected) exposure to covid-19: Secondary | ICD-10-CM | POA: Diagnosis present

## 2019-12-24 DIAGNOSIS — K922 Gastrointestinal hemorrhage, unspecified: Secondary | ICD-10-CM | POA: Diagnosis present

## 2019-12-24 DIAGNOSIS — K6289 Other specified diseases of anus and rectum: Secondary | ICD-10-CM | POA: Diagnosis not present

## 2019-12-24 DIAGNOSIS — Z6821 Body mass index (BMI) 21.0-21.9, adult: Secondary | ICD-10-CM | POA: Diagnosis not present

## 2019-12-24 DIAGNOSIS — I10 Essential (primary) hypertension: Secondary | ICD-10-CM | POA: Diagnosis present

## 2019-12-24 DIAGNOSIS — K625 Hemorrhage of anus and rectum: Secondary | ICD-10-CM | POA: Diagnosis not present

## 2019-12-24 DIAGNOSIS — D6832 Hemorrhagic disorder due to extrinsic circulating anticoagulants: Secondary | ICD-10-CM | POA: Diagnosis present

## 2019-12-24 DIAGNOSIS — Z951 Presence of aortocoronary bypass graft: Secondary | ICD-10-CM | POA: Diagnosis not present

## 2019-12-24 DIAGNOSIS — C187 Malignant neoplasm of sigmoid colon: Secondary | ICD-10-CM | POA: Diagnosis present

## 2019-12-24 DIAGNOSIS — K7689 Other specified diseases of liver: Secondary | ICD-10-CM | POA: Diagnosis not present

## 2019-12-24 DIAGNOSIS — Z87891 Personal history of nicotine dependence: Secondary | ICD-10-CM | POA: Diagnosis not present

## 2019-12-24 DIAGNOSIS — K5730 Diverticulosis of large intestine without perforation or abscess without bleeding: Secondary | ICD-10-CM | POA: Diagnosis present

## 2019-12-24 DIAGNOSIS — T508X5A Adverse effect of diagnostic agents, initial encounter: Secondary | ICD-10-CM | POA: Diagnosis not present

## 2019-12-24 DIAGNOSIS — T45515A Adverse effect of anticoagulants, initial encounter: Secondary | ICD-10-CM | POA: Diagnosis present

## 2019-12-24 DIAGNOSIS — K921 Melena: Secondary | ICD-10-CM | POA: Diagnosis not present

## 2019-12-24 DIAGNOSIS — N2889 Other specified disorders of kidney and ureter: Secondary | ICD-10-CM | POA: Diagnosis not present

## 2020-01-03 DIAGNOSIS — Z432 Encounter for attention to ileostomy: Secondary | ICD-10-CM | POA: Diagnosis not present

## 2020-01-03 DIAGNOSIS — Z951 Presence of aortocoronary bypass graft: Secondary | ICD-10-CM | POA: Diagnosis not present

## 2020-01-03 DIAGNOSIS — I10 Essential (primary) hypertension: Secondary | ICD-10-CM | POA: Diagnosis not present

## 2020-01-03 DIAGNOSIS — Z48816 Encounter for surgical aftercare following surgery on the genitourinary system: Secondary | ICD-10-CM | POA: Diagnosis not present

## 2020-01-03 DIAGNOSIS — Z960 Presence of urogenital implants: Secondary | ICD-10-CM | POA: Diagnosis not present

## 2020-01-03 DIAGNOSIS — I2510 Atherosclerotic heart disease of native coronary artery without angina pectoris: Secondary | ICD-10-CM | POA: Diagnosis not present

## 2020-01-03 DIAGNOSIS — E785 Hyperlipidemia, unspecified: Secondary | ICD-10-CM | POA: Diagnosis not present

## 2020-01-03 DIAGNOSIS — Z48815 Encounter for surgical aftercare following surgery on the digestive system: Secondary | ICD-10-CM | POA: Diagnosis not present

## 2020-01-03 DIAGNOSIS — K6289 Other specified diseases of anus and rectum: Secondary | ICD-10-CM | POA: Diagnosis not present

## 2020-01-04 DIAGNOSIS — Z48816 Encounter for surgical aftercare following surgery on the genitourinary system: Secondary | ICD-10-CM | POA: Diagnosis not present

## 2020-01-04 DIAGNOSIS — Z960 Presence of urogenital implants: Secondary | ICD-10-CM | POA: Diagnosis not present

## 2020-01-04 DIAGNOSIS — K6289 Other specified diseases of anus and rectum: Secondary | ICD-10-CM | POA: Diagnosis not present

## 2020-01-04 DIAGNOSIS — I2510 Atherosclerotic heart disease of native coronary artery without angina pectoris: Secondary | ICD-10-CM | POA: Diagnosis not present

## 2020-01-04 DIAGNOSIS — Z48815 Encounter for surgical aftercare following surgery on the digestive system: Secondary | ICD-10-CM | POA: Diagnosis not present

## 2020-01-04 DIAGNOSIS — Z432 Encounter for attention to ileostomy: Secondary | ICD-10-CM | POA: Diagnosis not present

## 2020-01-05 DIAGNOSIS — Z48815 Encounter for surgical aftercare following surgery on the digestive system: Secondary | ICD-10-CM | POA: Diagnosis not present

## 2020-01-05 DIAGNOSIS — I2510 Atherosclerotic heart disease of native coronary artery without angina pectoris: Secondary | ICD-10-CM | POA: Diagnosis not present

## 2020-01-05 DIAGNOSIS — K6289 Other specified diseases of anus and rectum: Secondary | ICD-10-CM | POA: Diagnosis not present

## 2020-01-05 DIAGNOSIS — Z48816 Encounter for surgical aftercare following surgery on the genitourinary system: Secondary | ICD-10-CM | POA: Diagnosis not present

## 2020-01-05 DIAGNOSIS — Z960 Presence of urogenital implants: Secondary | ICD-10-CM | POA: Diagnosis not present

## 2020-01-05 DIAGNOSIS — Z432 Encounter for attention to ileostomy: Secondary | ICD-10-CM | POA: Diagnosis not present

## 2020-01-07 DIAGNOSIS — Z79899 Other long term (current) drug therapy: Secondary | ICD-10-CM | POA: Diagnosis not present

## 2020-01-07 DIAGNOSIS — Z7902 Long term (current) use of antithrombotics/antiplatelets: Secondary | ICD-10-CM | POA: Diagnosis not present

## 2020-01-07 DIAGNOSIS — E7800 Pure hypercholesterolemia, unspecified: Secondary | ICD-10-CM | POA: Diagnosis not present

## 2020-01-07 DIAGNOSIS — Z951 Presence of aortocoronary bypass graft: Secondary | ICD-10-CM | POA: Diagnosis not present

## 2020-01-07 DIAGNOSIS — K9189 Other postprocedural complications and disorders of digestive system: Secondary | ICD-10-CM | POA: Diagnosis not present

## 2020-01-07 DIAGNOSIS — I10 Essential (primary) hypertension: Secondary | ICD-10-CM | POA: Diagnosis not present

## 2020-01-07 DIAGNOSIS — Z87891 Personal history of nicotine dependence: Secondary | ICD-10-CM | POA: Diagnosis not present

## 2020-01-07 DIAGNOSIS — I2510 Atherosclerotic heart disease of native coronary artery without angina pectoris: Secondary | ICD-10-CM | POA: Diagnosis not present

## 2020-01-08 DIAGNOSIS — C187 Malignant neoplasm of sigmoid colon: Secondary | ICD-10-CM | POA: Diagnosis not present

## 2020-01-09 DIAGNOSIS — Z48816 Encounter for surgical aftercare following surgery on the genitourinary system: Secondary | ICD-10-CM | POA: Diagnosis not present

## 2020-01-09 DIAGNOSIS — Z960 Presence of urogenital implants: Secondary | ICD-10-CM | POA: Diagnosis not present

## 2020-01-09 DIAGNOSIS — I2510 Atherosclerotic heart disease of native coronary artery without angina pectoris: Secondary | ICD-10-CM | POA: Diagnosis not present

## 2020-01-09 DIAGNOSIS — Z432 Encounter for attention to ileostomy: Secondary | ICD-10-CM | POA: Diagnosis not present

## 2020-01-09 DIAGNOSIS — K6289 Other specified diseases of anus and rectum: Secondary | ICD-10-CM | POA: Diagnosis not present

## 2020-01-09 DIAGNOSIS — Z48815 Encounter for surgical aftercare following surgery on the digestive system: Secondary | ICD-10-CM | POA: Diagnosis not present

## 2020-01-10 DIAGNOSIS — Z9049 Acquired absence of other specified parts of digestive tract: Secondary | ICD-10-CM | POA: Diagnosis not present

## 2020-01-10 DIAGNOSIS — C187 Malignant neoplasm of sigmoid colon: Secondary | ICD-10-CM | POA: Diagnosis not present

## 2020-01-11 DIAGNOSIS — Z48816 Encounter for surgical aftercare following surgery on the genitourinary system: Secondary | ICD-10-CM | POA: Diagnosis not present

## 2020-01-11 DIAGNOSIS — Z960 Presence of urogenital implants: Secondary | ICD-10-CM | POA: Diagnosis not present

## 2020-01-11 DIAGNOSIS — Z432 Encounter for attention to ileostomy: Secondary | ICD-10-CM | POA: Diagnosis not present

## 2020-01-11 DIAGNOSIS — Z48815 Encounter for surgical aftercare following surgery on the digestive system: Secondary | ICD-10-CM | POA: Diagnosis not present

## 2020-01-11 DIAGNOSIS — K6289 Other specified diseases of anus and rectum: Secondary | ICD-10-CM | POA: Diagnosis not present

## 2020-01-11 DIAGNOSIS — I2510 Atherosclerotic heart disease of native coronary artery without angina pectoris: Secondary | ICD-10-CM | POA: Diagnosis not present

## 2020-01-12 DIAGNOSIS — Z960 Presence of urogenital implants: Secondary | ICD-10-CM | POA: Diagnosis not present

## 2020-01-12 DIAGNOSIS — I2510 Atherosclerotic heart disease of native coronary artery without angina pectoris: Secondary | ICD-10-CM | POA: Diagnosis not present

## 2020-01-12 DIAGNOSIS — Z432 Encounter for attention to ileostomy: Secondary | ICD-10-CM | POA: Diagnosis not present

## 2020-01-12 DIAGNOSIS — Z48816 Encounter for surgical aftercare following surgery on the genitourinary system: Secondary | ICD-10-CM | POA: Diagnosis not present

## 2020-01-12 DIAGNOSIS — K6289 Other specified diseases of anus and rectum: Secondary | ICD-10-CM | POA: Diagnosis not present

## 2020-01-12 DIAGNOSIS — Z48815 Encounter for surgical aftercare following surgery on the digestive system: Secondary | ICD-10-CM | POA: Diagnosis not present

## 2020-01-15 DIAGNOSIS — C20 Malignant neoplasm of rectum: Secondary | ICD-10-CM | POA: Diagnosis not present

## 2020-01-16 DIAGNOSIS — Z432 Encounter for attention to ileostomy: Secondary | ICD-10-CM | POA: Diagnosis not present

## 2020-01-16 DIAGNOSIS — Z960 Presence of urogenital implants: Secondary | ICD-10-CM | POA: Diagnosis not present

## 2020-01-16 DIAGNOSIS — K6289 Other specified diseases of anus and rectum: Secondary | ICD-10-CM | POA: Diagnosis not present

## 2020-01-16 DIAGNOSIS — Z48816 Encounter for surgical aftercare following surgery on the genitourinary system: Secondary | ICD-10-CM | POA: Diagnosis not present

## 2020-01-16 DIAGNOSIS — I2510 Atherosclerotic heart disease of native coronary artery without angina pectoris: Secondary | ICD-10-CM | POA: Diagnosis not present

## 2020-01-16 DIAGNOSIS — Z48815 Encounter for surgical aftercare following surgery on the digestive system: Secondary | ICD-10-CM | POA: Diagnosis not present

## 2020-01-18 DIAGNOSIS — Z960 Presence of urogenital implants: Secondary | ICD-10-CM | POA: Diagnosis not present

## 2020-01-18 DIAGNOSIS — Z48815 Encounter for surgical aftercare following surgery on the digestive system: Secondary | ICD-10-CM | POA: Diagnosis not present

## 2020-01-18 DIAGNOSIS — Z48816 Encounter for surgical aftercare following surgery on the genitourinary system: Secondary | ICD-10-CM | POA: Diagnosis not present

## 2020-01-18 DIAGNOSIS — Z432 Encounter for attention to ileostomy: Secondary | ICD-10-CM | POA: Diagnosis not present

## 2020-01-18 DIAGNOSIS — K6289 Other specified diseases of anus and rectum: Secondary | ICD-10-CM | POA: Diagnosis not present

## 2020-01-18 DIAGNOSIS — I2510 Atherosclerotic heart disease of native coronary artery without angina pectoris: Secondary | ICD-10-CM | POA: Diagnosis not present

## 2020-01-19 DIAGNOSIS — Z20822 Contact with and (suspected) exposure to covid-19: Secondary | ICD-10-CM | POA: Diagnosis not present

## 2020-01-19 DIAGNOSIS — Z960 Presence of urogenital implants: Secondary | ICD-10-CM | POA: Diagnosis not present

## 2020-01-19 DIAGNOSIS — Z48815 Encounter for surgical aftercare following surgery on the digestive system: Secondary | ICD-10-CM | POA: Diagnosis not present

## 2020-01-19 DIAGNOSIS — C20 Malignant neoplasm of rectum: Secondary | ICD-10-CM | POA: Diagnosis not present

## 2020-01-19 DIAGNOSIS — Z48816 Encounter for surgical aftercare following surgery on the genitourinary system: Secondary | ICD-10-CM | POA: Diagnosis not present

## 2020-01-19 DIAGNOSIS — K6289 Other specified diseases of anus and rectum: Secondary | ICD-10-CM | POA: Diagnosis not present

## 2020-01-19 DIAGNOSIS — Z432 Encounter for attention to ileostomy: Secondary | ICD-10-CM | POA: Diagnosis not present

## 2020-01-19 DIAGNOSIS — I2510 Atherosclerotic heart disease of native coronary artery without angina pectoris: Secondary | ICD-10-CM | POA: Diagnosis not present

## 2020-01-22 DIAGNOSIS — K922 Gastrointestinal hemorrhage, unspecified: Secondary | ICD-10-CM | POA: Diagnosis not present

## 2020-01-22 DIAGNOSIS — K6389 Other specified diseases of intestine: Secondary | ICD-10-CM | POA: Diagnosis not present

## 2020-01-23 DIAGNOSIS — I2510 Atherosclerotic heart disease of native coronary artery without angina pectoris: Secondary | ICD-10-CM | POA: Diagnosis present

## 2020-01-23 DIAGNOSIS — K9419 Other complications of enterostomy: Secondary | ICD-10-CM | POA: Diagnosis not present

## 2020-01-23 DIAGNOSIS — Z7902 Long term (current) use of antithrombotics/antiplatelets: Secondary | ICD-10-CM | POA: Diagnosis not present

## 2020-01-23 DIAGNOSIS — Z85048 Personal history of other malignant neoplasm of rectum, rectosigmoid junction, and anus: Secondary | ICD-10-CM | POA: Diagnosis not present

## 2020-01-23 DIAGNOSIS — Z432 Encounter for attention to ileostomy: Secondary | ICD-10-CM | POA: Diagnosis not present

## 2020-01-23 DIAGNOSIS — E785 Hyperlipidemia, unspecified: Secondary | ICD-10-CM | POA: Diagnosis present

## 2020-01-23 DIAGNOSIS — Z951 Presence of aortocoronary bypass graft: Secondary | ICD-10-CM | POA: Diagnosis not present

## 2020-01-23 DIAGNOSIS — Z955 Presence of coronary angioplasty implant and graft: Secondary | ICD-10-CM | POA: Diagnosis not present

## 2020-01-23 DIAGNOSIS — C20 Malignant neoplasm of rectum: Secondary | ICD-10-CM | POA: Diagnosis not present

## 2020-01-23 DIAGNOSIS — I10 Essential (primary) hypertension: Secondary | ICD-10-CM | POA: Diagnosis present

## 2020-01-23 DIAGNOSIS — D630 Anemia in neoplastic disease: Secondary | ICD-10-CM | POA: Diagnosis present

## 2020-01-27 DIAGNOSIS — C20 Malignant neoplasm of rectum: Secondary | ICD-10-CM | POA: Diagnosis not present

## 2020-02-01 DIAGNOSIS — C20 Malignant neoplasm of rectum: Secondary | ICD-10-CM | POA: Diagnosis not present

## 2020-02-08 DIAGNOSIS — C187 Malignant neoplasm of sigmoid colon: Secondary | ICD-10-CM | POA: Diagnosis not present

## 2020-02-11 DIAGNOSIS — I21A1 Myocardial infarction type 2: Secondary | ICD-10-CM | POA: Diagnosis present

## 2020-02-11 DIAGNOSIS — D631 Anemia in chronic kidney disease: Secondary | ICD-10-CM | POA: Diagnosis not present

## 2020-02-11 DIAGNOSIS — I083 Combined rheumatic disorders of mitral, aortic and tricuspid valves: Secondary | ICD-10-CM | POA: Diagnosis not present

## 2020-02-11 DIAGNOSIS — I5043 Acute on chronic combined systolic (congestive) and diastolic (congestive) heart failure: Secondary | ICD-10-CM | POA: Diagnosis present

## 2020-02-11 DIAGNOSIS — Z20822 Contact with and (suspected) exposure to covid-19: Secondary | ICD-10-CM | POA: Diagnosis present

## 2020-02-11 DIAGNOSIS — I5041 Acute combined systolic (congestive) and diastolic (congestive) heart failure: Secondary | ICD-10-CM | POA: Diagnosis not present

## 2020-02-11 DIAGNOSIS — N179 Acute kidney failure, unspecified: Secondary | ICD-10-CM | POA: Diagnosis not present

## 2020-02-11 DIAGNOSIS — E785 Hyperlipidemia, unspecified: Secondary | ICD-10-CM | POA: Diagnosis present

## 2020-02-11 DIAGNOSIS — I509 Heart failure, unspecified: Secondary | ICD-10-CM | POA: Diagnosis not present

## 2020-02-11 DIAGNOSIS — J90 Pleural effusion, not elsewhere classified: Secondary | ICD-10-CM | POA: Diagnosis not present

## 2020-02-11 DIAGNOSIS — Z951 Presence of aortocoronary bypass graft: Secondary | ICD-10-CM | POA: Diagnosis not present

## 2020-02-11 DIAGNOSIS — I10 Essential (primary) hypertension: Secondary | ICD-10-CM | POA: Diagnosis not present

## 2020-02-11 DIAGNOSIS — I161 Hypertensive emergency: Secondary | ICD-10-CM | POA: Diagnosis present

## 2020-02-11 DIAGNOSIS — R0602 Shortness of breath: Secondary | ICD-10-CM | POA: Diagnosis not present

## 2020-02-11 DIAGNOSIS — I459 Conduction disorder, unspecified: Secondary | ICD-10-CM | POA: Diagnosis not present

## 2020-02-11 DIAGNOSIS — D649 Anemia, unspecified: Secondary | ICD-10-CM | POA: Diagnosis present

## 2020-02-11 DIAGNOSIS — K6289 Other specified diseases of anus and rectum: Secondary | ICD-10-CM | POA: Diagnosis not present

## 2020-02-11 DIAGNOSIS — I130 Hypertensive heart and chronic kidney disease with heart failure and stage 1 through stage 4 chronic kidney disease, or unspecified chronic kidney disease: Secondary | ICD-10-CM | POA: Diagnosis present

## 2020-02-11 DIAGNOSIS — I2510 Atherosclerotic heart disease of native coronary artery without angina pectoris: Secondary | ICD-10-CM | POA: Diagnosis not present

## 2020-02-11 DIAGNOSIS — E872 Acidosis: Secondary | ICD-10-CM | POA: Diagnosis present

## 2020-02-11 DIAGNOSIS — N1832 Chronic kidney disease, stage 3b: Secondary | ICD-10-CM | POA: Diagnosis present

## 2020-02-11 DIAGNOSIS — I5023 Acute on chronic systolic (congestive) heart failure: Secondary | ICD-10-CM | POA: Diagnosis not present

## 2020-02-11 DIAGNOSIS — Z743 Need for continuous supervision: Secondary | ICD-10-CM | POA: Diagnosis not present

## 2020-02-11 DIAGNOSIS — I110 Hypertensive heart disease with heart failure: Secondary | ICD-10-CM | POA: Diagnosis not present

## 2020-02-11 DIAGNOSIS — I340 Nonrheumatic mitral (valve) insufficiency: Secondary | ICD-10-CM | POA: Diagnosis present

## 2020-02-11 DIAGNOSIS — I493 Ventricular premature depolarization: Secondary | ICD-10-CM | POA: Diagnosis not present

## 2020-02-11 DIAGNOSIS — J9601 Acute respiratory failure with hypoxia: Secondary | ICD-10-CM | POA: Diagnosis not present

## 2020-02-11 DIAGNOSIS — R0603 Acute respiratory distress: Secondary | ICD-10-CM | POA: Diagnosis not present

## 2020-02-11 DIAGNOSIS — Z7902 Long term (current) use of antithrombotics/antiplatelets: Secondary | ICD-10-CM | POA: Diagnosis not present

## 2020-02-13 ENCOUNTER — Telehealth: Payer: Self-pay | Admitting: Cardiovascular Disease

## 2020-02-13 MED ORDER — CARVEDILOL 6.25 MG PO TABS
6.25 | ORAL_TABLET | ORAL | Status: DC
Start: 2020-02-12 — End: 2020-02-13

## 2020-02-13 MED ORDER — LOSARTAN POTASSIUM 25 MG PO TABS
25.00 | ORAL_TABLET | ORAL | Status: DC
Start: 2020-02-13 — End: 2020-02-13

## 2020-02-13 MED ORDER — CLOPIDOGREL BISULFATE 75 MG PO TABS
75.00 | ORAL_TABLET | ORAL | Status: DC
Start: 2020-02-13 — End: 2020-02-13

## 2020-02-13 MED ORDER — ATORVASTATIN CALCIUM 10 MG PO TABS
10.00 | ORAL_TABLET | ORAL | Status: DC
Start: 2020-02-12 — End: 2020-02-13

## 2020-02-13 MED ORDER — FUROSEMIDE 20 MG PO TABS
20.00 | ORAL_TABLET | ORAL | Status: DC
Start: 2020-02-13 — End: 2020-02-13

## 2020-02-13 NOTE — Telephone Encounter (Signed)
New message   Patient states that he would like to see Dr. Angelena Form and not a PA because the patient has been in hospital in [place] and wants to to if Dr. Angelena Form can work him in the schedule. Please advise.

## 2020-02-14 NOTE — Telephone Encounter (Signed)
Patient calling back. I informed him of his appointment 03/06/2020. He states he would still like a call back from a nurse to discuss his hospital visit.

## 2020-02-14 NOTE — Telephone Encounter (Signed)
I spoke with patient and let him know some information from his recent hospital stay in [place] was viewable in his chart.  Patient reports he is feeling fine but would like to follow up with Dr Angelena Form as soon as possible.  He is returning to [place] next week.  He does not want to see an APP.  Patient will plan on keeping appointment on 6/23.  I told him we would contact him if sooner appointment with Dr Angelena Form became available.

## 2020-02-15 DIAGNOSIS — Z4889 Encounter for other specified surgical aftercare: Secondary | ICD-10-CM | POA: Diagnosis not present

## 2020-02-15 DIAGNOSIS — C187 Malignant neoplasm of sigmoid colon: Secondary | ICD-10-CM | POA: Diagnosis not present

## 2020-02-19 ENCOUNTER — Ambulatory Visit (INDEPENDENT_AMBULATORY_CARE_PROVIDER_SITE_OTHER): Payer: Medicare Other | Admitting: Cardiovascular Disease

## 2020-02-19 ENCOUNTER — Other Ambulatory Visit: Payer: Self-pay

## 2020-02-19 ENCOUNTER — Encounter: Payer: Self-pay | Admitting: Cardiovascular Disease

## 2020-02-19 VITALS — BP 110/60 | HR 84 | Ht 69.0 in | Wt 153.0 lb

## 2020-02-19 DIAGNOSIS — I2510 Atherosclerotic heart disease of native coronary artery without angina pectoris: Secondary | ICD-10-CM | POA: Diagnosis not present

## 2020-02-19 DIAGNOSIS — I10 Essential (primary) hypertension: Secondary | ICD-10-CM

## 2020-02-19 DIAGNOSIS — I480 Paroxysmal atrial fibrillation: Secondary | ICD-10-CM

## 2020-02-19 DIAGNOSIS — I5022 Chronic systolic (congestive) heart failure: Secondary | ICD-10-CM

## 2020-02-19 DIAGNOSIS — K922 Gastrointestinal hemorrhage, unspecified: Secondary | ICD-10-CM | POA: Diagnosis not present

## 2020-02-19 DIAGNOSIS — I255 Ischemic cardiomyopathy: Secondary | ICD-10-CM | POA: Diagnosis not present

## 2020-02-19 DIAGNOSIS — I739 Peripheral vascular disease, unspecified: Secondary | ICD-10-CM

## 2020-02-19 DIAGNOSIS — I340 Nonrheumatic mitral (valve) insufficiency: Secondary | ICD-10-CM

## 2020-02-19 DIAGNOSIS — E785 Hyperlipidemia, unspecified: Secondary | ICD-10-CM | POA: Diagnosis not present

## 2020-02-19 DIAGNOSIS — E46 Unspecified protein-calorie malnutrition: Secondary | ICD-10-CM | POA: Diagnosis not present

## 2020-02-19 DIAGNOSIS — I5023 Acute on chronic systolic (congestive) heart failure: Secondary | ICD-10-CM | POA: Diagnosis not present

## 2020-02-19 MED ORDER — LOSARTAN POTASSIUM 25 MG PO TABS: 25.0000 mg | ORAL_TABLET | Freq: Every day | ORAL | 3 refills | Status: DC

## 2020-02-19 NOTE — Progress Notes (Signed)
Evaluation Performed:  Follow-up visit  Date:  02/19/2020   ID:  Dean Bean, DOB 30-Sep-1930, MRN [id number]  Chief Complaint:   Chief Complaint  Patient presents with  . Follow-up    CAD   History of Present Illness:    Dean Bean is a 86 y.o. male with history of CKD, CAD s/p 4V CABG in 2013, atrial flutter/fib s/p ablation 2013, ischemic cardiomyopathy, chronic systolic CHF and COPD who is here today for cardiac follow up. I met him as a new patient to establish cardiology care on 10/02/16. He had been followed in [place] by Dr. Rozetta Bean but moved to [facility] at the end of 2017. Cardiac history includes 4V CABG in November 2013 (LIMA to LAD, SVG to OM, SVG to PDA, SVG to Diagonal) and stenting of the left main artery in 2016 (DES placed from left main into the diagonal not protected by LIMA graft). He had an atrial flutter ablation in 2013. He had an echo in 2016 with normal LVEF and mild MR. He is also known to have PAD. He was told he had blockages in his legs and was seen by vascular surgery but he says they said there was nothing to worry about. Former smoker (50 pack years, stopped smoking in 1998). He was admitted to [facility] April 2018 with dyspnea and was felt to be volume overloaded. He was diuresed with IV Lasix and had rapid improvement in his dyspnea. Echo 12/21/16 with LVEF=40-45% with mild MR. He was admitted to [facility] 09/08/19 with dyspnea, hypertensive emergency. He was diuresed for mild volume overload and started on Imdur. Echo 09/09/19 with LVEF=45-50%, unchanged. Mild MR. He was in [place] following his wife's death and had a rectal mass identified. This has since been removed. He had his colostomy reversed recently. He was admitted with acute CHF in [place] last week in the setting of a hypertensive crisis. He was anemic. He was diuresed with IV Lasix. Echo in [place] with JXBJ=47-82%, grade 2 diastolic dysfunction. Moderate to severe MR. Mild AS. He was told  there that he needed a MitraClip. His echo in December 2020 here and April 2021 in [place] showed mild to moderate MR.   He is here today for follow up. The patient denies any chest pain, dyspnea, palpitations, lower extremity edema, orthopnea, PND, dizziness, near syncope or syncope. Overall feeling stronger.    Primary Care Physician: Dean Stains, MD   Past Medical History:  Diagnosis Date  . Atrial fibrillation ([place])   . CAD (coronary artery disease)    s/p CABG  . COPD (chronic obstructive pulmonary disease) ([place])   . HTN (hypertension)   . Ischemic cardiomyopathy   . Mitral regurgitation   . Systolic CHF [facility])    Past Surgical History:  Procedure Laterality Date  . CORONARY ARTERY BYPASS GRAFT     4Vessel     Current Meds  Medication Sig  . carvedilol (COREG) 6.25 MG tablet Take 6.25 mg by mouth 2 (two) times daily.  . clopidogrel (PLAVIX) 75 MG tablet Take 75 mg by mouth daily.  Marland Kitchen Dextromethorphan-guaiFENesin (CORICIDIN HBP CONGESTION/COUGH PO) Take 1 tablet by mouth as needed.  . furosemide (LASIX) 40 MG tablet Take 1 tablet by mouth daily.  Marland Kitchen losartan (COZAAR) 25 MG tablet Take 1 tablet (25 mg total) by mouth daily.  . simvastatin (ZOCOR) 20 MG tablet Take 20 mg by mouth daily.  . [DISCONTINUED] losartan (COZAAR) 25 MG tablet Take 1 tablet by mouth  daily.     Allergies:   Patient has no known allergies.   Social History   Tobacco Use  . Smoking status: Former Smoker    Packs/day: 1.00    Years: 50.00    Pack years: 50.00    Types: Cigarettes    Quit date: 10/02/1996    Years since quitting: 23.3  . Smokeless tobacco: Never Used  Substance Use Topics  . Alcohol use: Yes    Alcohol/week: 7.0 standard drinks    Types: 7 Glasses of wine per week  . Drug use: No     Family Hx: The patient's family history includes Cancer in his mother; Heart attack (age of onset: 1) in his father.  ROS:   Please see the history of present illness.    All other systems  reviewed and are negative.   Prior CV studies:   The following studies were reviewed today:  Echo December 2020:   1. Left ventricular ejection fraction, by visual estimation, is 45 to 50%. The left ventricle has mildly decreased function. There is mildly increased left ventricular hypertrophy.  2. Basal and mid inferior septum and basal and mid inferior wall are abnormal.  3. Left ventricular diastolic parameters are consistent with Grade II diastolic dysfunction (pseudonormalization).  4. The left ventricle demonstrates regional wall motion abnormalities.  5. Global right ventricle has normal systolic function.The right ventricular size is normal. No increase in right ventricular wall thickness.  6. Left atrial size was normal.  7. Right atrial size was normal.  8. The mitral valve is normal in structure. Mild mitral valve regurgitation. No evidence of mitral stenosis.  9. The tricuspid valve is normal in structure. 10. The aortic valve is normal in structure. Aortic valve regurgitation is not visualized. No evidence of aortic valve sclerosis or stenosis. 11. The pulmonic valve was normal in structure. Pulmonic valve regurgitation is not visualized. 12. The inferior vena cava is normal in size with greater than 50% respiratory variability, suggesting right atrial pressure of 3 mmHg.  Labs/Other Tests and Data Reviewed:    EKG:  The EKG is not performed today The EKG is personally reviewed by me and shows   Recent Labs: 09/08/2019: ALT 18; B Natriuretic Peptide 436.8 09/09/2019: BUN 33; Creatinine, Ser 1.66; Hemoglobin 12.9; Magnesium 2.0; Platelets 272; Potassium 4.3; Sodium 138   Recent Lipid Panel No results found for: CHOL, TRIG, HDL, CHOLHDL, LDLCALC, LDLDIRECT  Wt Readings from Last 3 Encounters:  02/19/20 153 lb (69.4 kg)  09/25/19 151 lb (68.5 kg)  09/08/19 155 lb 12.8 oz (70.7 kg)     Objective:    Vital Signs:  BP 110/60   Pulse 84   Ht 5' 9" (1.753 m)   Wt 153  lb (69.4 kg)   SpO2 96%   BMI 22.59 kg/m    General: Well developed, well nourished, NAD  HEENT: OP clear, mucus membranes moist  SKIN: warm, dry. No rashes. Neuro: No focal deficits  Musculoskeletal: Muscle strength 5/5 all ext  Psychiatric: Mood and affect normal  Neck: No JVD, no carotid bruits, no thyromegaly, no lymphadenopathy.  Lungs:Clear bilaterally, no wheezes, rhonci, crackles Cardiovascular: Regular rate and rhythm. Systolic murmur.  Abdomen:Soft. Bowel sounds present. Non-tender.  Extremities: No lower extremity edema. Pulses are 2 + in the bilateral DP/PT.   ASSESSMENT & PLAN:     1. CAD s/p CABG without angina: He had CABG in 2013 and then had stenting of his left main into the diagonal branch  in 2016. This branch was unprotected by the grafts. No chest pain. Will continue Plavix, statin and beta blocker.          2. Ischemic cardiomyopathy: Mild LV systolic dysfunction by echo in December 2020 with LVEF=45%. Will continue beta blocker, Losartan and Imdur.   3. Chronic systolic CHF:  No evidence of volume overload. Weight is stable. Will continue Lasix 20 mg daily and follow daily weights.     4. PAD: He has known PAD by non-invasive studies at [facility] in 2014 with ABI 0.76 on the left and 0.46 on the right. We have spoken in the past about repeating ABI testing here but he has wished to delay. He will let us know if he has pain in his legs.    5. Atrial fibrillation/flutter: He is s/p ablation. He has not been on long term anticoagulation since he has has no recurrence of his AF following ablation. He has no palpitations. Will continue beta blocker.    6. Former tobacco abuse: He stopped smoking in 1998.   7. Mitral regurgitation: Mild by echo December 2020. Report of moderate to severe MR in [place] last week. He is feeling well. No loud systolic murmur on exam. I will repeat an echo in 6 weeks to confirm his MR is not severe.   8. HTN: BP is controlled.    Medication Adjustments/Labs and Tests Ordered: Current medicines are reviewed at length with the patient today.  Concerns regarding medicines are outlined above.   Tests Ordered: Orders Placed This Encounter  Procedures  . ECHOCARDIOGRAM COMPLETE    Medication Changes: Meds ordered this encounter  Medications  . losartan (COZAAR) 25 MG tablet    Sig: Take 1 tablet (25 mg total) by mouth daily.    Dispense:  90 tablet    Refill:  3    Disposition:  Follow up in 6 month(s)  Signed, Lauree Chandler, MD  02/19/2020 4:42 PM    [place]

## 2020-02-19 NOTE — Patient Instructions (Addendum)
Medication Instructions:  No changes *If you need a refill on your cardiac medications before your next appointment, please call your pharmacy*   Lab Work: none  Testing/Procedures: Your physician has requested that you have an echocardiogram. Echocardiography is a painless test that uses sound waves to create images of your heart. It provides your doctor with information about the size and shape of your heart and how well your heart's chambers and valves are working. This procedure takes approximately one hour. There are no restrictions for this procedure.   Follow-Up: At [facility], you and your health needs are our priority.  As part of our continuing mission to provide you with exceptional heart care, we have created designated Provider Care Teams.  These Care Teams include your primary Cardiologist (physician) and Advanced Practice Providers (APPs -  Physician Assistants and Nurse Practitioners) who all work together to provide you with the care you need, when you need it.  We recommend signing up for the patient portal called "MyChart".  Sign up information is provided on this After Visit Summary.  MyChart is used to connect with patients for Virtual Visits (Telemedicine).  Patients are able to view lab/test results, encounter notes, upcoming appointments, etc.  Non-urgent messages can be sent to your provider as well.   To learn more about what you can do with MyChart, go to [email or web address].    Your next appointment:   2 month(s)  The format for your next appointment:   In Person  Provider:   Lauree Chandler, MD   Other Instructions

## 2020-02-26 DIAGNOSIS — Z4801 Encounter for change or removal of surgical wound dressing: Secondary | ICD-10-CM | POA: Diagnosis not present

## 2020-03-06 ENCOUNTER — Ambulatory Visit: Payer: Medicare Other | Admitting: Cardiovascular Disease

## 2020-03-21 DIAGNOSIS — D509 Iron deficiency anemia, unspecified: Secondary | ICD-10-CM | POA: Diagnosis not present

## 2020-03-21 DIAGNOSIS — R21 Rash and other nonspecific skin eruption: Secondary | ICD-10-CM | POA: Diagnosis not present

## 2020-03-21 DIAGNOSIS — I5020 Unspecified systolic (congestive) heart failure: Secondary | ICD-10-CM | POA: Diagnosis not present

## 2020-04-02 ENCOUNTER — Ambulatory Visit (HOSPITAL_COMMUNITY): Payer: Medicare Other | Attending: Cardiovascular Disease

## 2020-04-02 ENCOUNTER — Other Ambulatory Visit: Payer: Self-pay

## 2020-04-02 ENCOUNTER — Telehealth: Payer: Self-pay | Admitting: Cardiovascular Disease

## 2020-04-02 DIAGNOSIS — I340 Nonrheumatic mitral (valve) insufficiency: Secondary | ICD-10-CM | POA: Insufficient documentation

## 2020-04-02 LAB — ECHOCARDIOGRAM COMPLETE
Area-P 1/2: 3.83 cm2
MV M vel: 6.32 m/s
MV Peak grad: 159.5 mmHg
Radius: 0.6 cm
S' Lateral: 3.3 cm

## 2020-04-02 NOTE — Telephone Encounter (Signed)
Pt has bumpy raised rash on shoulders, knees, below the waist, thighs.  Itchy but tolerable except at night Itch is terrible. Crusty around ankles a little bit.  May have been itchy -started just in feet- since hospitalization couple months ago in [place].  That is when he started losartan.  Had been on lisinopril in past which he tolerated but stopped because BP low.  All over body is very faintly red, and blotches scattered all over.     Not tried benadryl because it makes his HR fast. Tried itch relief creams for eczema and psoriosis and Aveeno w calamine.  Tones it a little but doesn't last long.   Does not have one dermatologist.  BP little lower since on losartan 101/ compared to 120s-130s off losartan.  -taking for HF  His PCP asks if could be losartan. He is aware I will forward to our PharmD and to Dr. Angelena Form and will call him w recommendations. He would be ok stopping it briefly to see if rash clears.

## 2020-04-02 NOTE — Telephone Encounter (Signed)
Pt c/o medication issue:  1. Name of Medication: losartan (COZAAR) 25 MG tablet  2. How are you currently taking this medication (dosage and times per day)? 25 mg daily as directed   3. Are you having a reaction (difficulty breathing--STAT)? SOB, Itching/ rash  4. What is your medication issue? Patient's PCP is concerned that there may be an interaction between this medication and some of the other medications he is on. The patient started developing a rash after starting this medication   Pt c/o Shortness Of Breath: STAT if SOB developed within the last 24 hours or pt is noticeably SOB on the phone  1. Are you currently SOB (can you hear that pt is SOB on the phone)? no  2. How long have you been experiencing SOB? ~ a month  3. Are you SOB when sitting or when up moving around? Moving around   4. Are you currently experiencing any other symptoms? Fatigue, rash from medication   Pt is at the office getting an echocardiogram at the time of his call.

## 2020-04-02 NOTE — Telephone Encounter (Signed)
Patient is on low dose losartan. Okay to HOLD for now to determine if rash improves.   Please schedule follow up visit with pharmacist to assess medication reaction and potential therapeutic alternative.

## 2020-04-02 NOTE — Telephone Encounter (Signed)
Spoke with patient and informed to hold losartan for now and scheduled visit w PharmD to assess medication reaction and potential therapeutic alternative.  He is in agreement w this plan.  I have scheduled him on 7/23 w PharmD.    I offered next week to give time for rash to clear or not, pt hoping to have sooner visit. Pt very appreciative for soon appointment.

## 2020-04-03 ENCOUNTER — Telehealth: Payer: Self-pay | Admitting: Cardiovascular Disease

## 2020-04-03 NOTE — Telephone Encounter (Signed)
Thanks

## 2020-04-03 NOTE — Telephone Encounter (Signed)
Patient is returning Ann's call regarding echo results. Transferred call to [place].

## 2020-04-05 ENCOUNTER — Other Ambulatory Visit: Payer: Self-pay

## 2020-04-05 ENCOUNTER — Ambulatory Visit (INDEPENDENT_AMBULATORY_CARE_PROVIDER_SITE_OTHER): Payer: Medicare Other | Admitting: Pharmacist

## 2020-04-05 VITALS — BP 150/62 | HR 77

## 2020-04-05 DIAGNOSIS — I10 Essential (primary) hypertension: Secondary | ICD-10-CM | POA: Diagnosis not present

## 2020-04-05 DIAGNOSIS — I255 Ischemic cardiomyopathy: Secondary | ICD-10-CM | POA: Diagnosis not present

## 2020-04-05 NOTE — Patient Instructions (Addendum)
It was nice meeting you today!  Continue holding losartan.  Continue checking your blood pressure at home. Pleasure bring your blood pressure monitor with you to your next visit.  Pick up a pair of compression stockings. 15-66mmg Wear the stockings while awake.  Call us at [phone] with any questions

## 2020-04-05 NOTE — Progress Notes (Signed)
Patient ID: Dean Bean                 DOB: [birth year]                      MRN: [id number]     HPI: Dean Bean is a 86 y.o. male referred by Dr. Angelena Form to HTN clinic. PMH is significant for CKD, CAD s/p 4V CABG in 2013, atrial flutter/fib s/p ablation 2013, ischemic cardiomyopathy, PAD chronic CHF and COPD. Patient was hospitalized in [place] in June for CHF. Most recent EF is  50-55% with grade II diastolic dysfunction. Losartan was started in hospital at the end of May. Itching started in hospital, but second week of June started to get a bumpy rash. Was on lisinopril before but was stopped due to low BP. He called the office on 7/20 stating he had a rash, itching since starting losartan. He was advised to stop losartan and follow up in PharmD clinic.  Patient presents today in good spirits. His rash is still there and he is still itching. Only been a few days since stopping. Using Calamine which helps a little bit. His blood pressure on losartan at home has been systolic 40'H or low 474'Q. Blood pressure in clinic was high 150/62. Patient states he has white coat syndrome.  He was on lisinopril in the past but his BP was too low even when reduced to 5mg  so it was stopped.  He denies dizziness, lightheadedness, headache or blurred vision. He gets SOB when he makes his bed. Has swelling (feet/ankles). Much improved in the AM. But after about an hour or so on his feet it swells back up. Daughter doesn't put salt on anything. Lives with daughter.  Tried taking furosemide 20mg  but swelling was worse. PCP increased back to 40mg .  Patient admits he thinks the rash and itching are more mental after all he has been through.  Current HTN meds: carvedilol 6.25mg  BID, furosemide 40mg  daily Previously tried: lisinopril (BP was too low on 5mg  so they stopped it) BP goal: <130/80  Family History: The patient's family history includes Cancer in his mother; Heart attack (age of onset: 71) in his  father.  Social History:  Former smoker (50 pack years, stopped smoking in 1998)  Diet: daughter does not cook with salt  Exercise: not much-   Home BP readings: 90's to low 595'G systolic on losartan. No readings off  Wt Readings from Last 3 Encounters:  02/19/20 153 lb (69.4 kg)  09/25/19 151 lb (68.5 kg)  09/08/19 155 lb 12.8 oz (70.7 kg)   BP Readings from Last 3 Encounters:  02/19/20 110/60  09/25/19 (!) 144/76  09/09/19 (!) 170/66   Pulse Readings from Last 3 Encounters:  02/19/20 84  09/25/19 77  09/09/19 64    Renal function: CrCl cannot be calculated (Patient's most recent lab result is older than the maximum 21 days allowed.).  Past Medical History:  Diagnosis Date  . Atrial fibrillation ([place])   . CAD (coronary artery disease)    s/p CABG  . COPD (chronic obstructive pulmonary disease) ([place])   . HTN (hypertension)   . Ischemic cardiomyopathy   . Mitral regurgitation   . Systolic CHF [facility])     Current Outpatient Medications on File Prior to Visit  Medication Sig Dispense Refill  . carvedilol (COREG) 6.25 MG tablet Take 6.25 mg by mouth 2 (two) times daily.    . clopidogrel (PLAVIX) 75 MG tablet  Take 75 mg by mouth daily.    Marland Kitchen Dextromethorphan-guaiFENesin (CORICIDIN HBP CONGESTION/COUGH PO) Take 1 tablet by mouth as needed.    . furosemide (LASIX) 40 MG tablet Take 1 tablet by mouth daily.    Marland Kitchen losartan (COZAAR) 25 MG tablet Take 1 tablet (25 mg total) by mouth daily. (Patient not taking: Reported on 04/02/2020) 90 tablet 3  . simvastatin (ZOCOR) 20 MG tablet Take 20 mg by mouth daily.     No current facility-administered medications on file prior to visit.    No Known Allergies  There were no vitals taken for this visit.   Assessment/Plan:  1. Hypertension - Blood pressure is high today in clinic, above goal of <130/80. Patient states its because he's at the doctors. BP was good at appointment with Dr. Angelena Form. Will continue to hold losartan to see  if rash improves. Will have patient check his blood pressure daily and bring his meter with him to next appointment to compare. I advised that he should contact his PCP if itching doesn't improve over the weekend. He does have a decent amount of swelling in his legs. I have recommended he wear compression stockings 15-72mmg. Follow up in clinic in 2 weeks. Ideally I would like him on an ACE or ARB due to previous low EF. May have to consider lisinopril 2.5mg  daily in the future.  Thank you  Ramond Dial, Pharm.D, BCPS, CPP [facility]  [address] [place], [place] [address]  Phone: [phone]; Fax: [phone]

## 2020-04-10 ENCOUNTER — Other Ambulatory Visit: Payer: Self-pay

## 2020-04-10 ENCOUNTER — Inpatient Hospital Stay (HOSPITAL_COMMUNITY)
Admission: EM | Admit: 2020-04-10 | Discharge: 2020-04-12 | DRG: 291 | Disposition: A | Discharge status: Home / Self Care | Payer: Medicare Other | Attending: Internal Medicine | Admitting: Internal Medicine

## 2020-04-10 ENCOUNTER — Other Ambulatory Visit (HOSPITAL_COMMUNITY): Payer: Self-pay

## 2020-04-10 ENCOUNTER — Encounter (HOSPITAL_COMMUNITY): Payer: Self-pay | Admitting: *Deleted

## 2020-04-10 ENCOUNTER — Emergency Department (HOSPITAL_COMMUNITY): Payer: Medicare Other

## 2020-04-10 DIAGNOSIS — J189 Pneumonia, unspecified organism: Secondary | ICD-10-CM | POA: Diagnosis not present

## 2020-04-10 DIAGNOSIS — D649 Anemia, unspecified: Secondary | ICD-10-CM | POA: Diagnosis not present

## 2020-04-10 DIAGNOSIS — I340 Nonrheumatic mitral (valve) insufficiency: Secondary | ICD-10-CM | POA: Diagnosis not present

## 2020-04-10 DIAGNOSIS — I130 Hypertensive heart and chronic kidney disease with heart failure and stage 1 through stage 4 chronic kidney disease, or unspecified chronic kidney disease: Principal | ICD-10-CM | POA: Diagnosis present

## 2020-04-10 DIAGNOSIS — I5023 Acute on chronic systolic (congestive) heart failure: Secondary | ICD-10-CM

## 2020-04-10 DIAGNOSIS — R739 Hyperglycemia, unspecified: Secondary | ICD-10-CM | POA: Diagnosis present

## 2020-04-10 DIAGNOSIS — R6510 Systemic inflammatory response syndrome (SIRS) of non-infectious origin without acute organ dysfunction: Secondary | ICD-10-CM

## 2020-04-10 DIAGNOSIS — R404 Transient alteration of awareness: Secondary | ICD-10-CM | POA: Diagnosis not present

## 2020-04-10 DIAGNOSIS — Z20822 Contact with and (suspected) exposure to covid-19: Secondary | ICD-10-CM | POA: Diagnosis present

## 2020-04-10 DIAGNOSIS — E876 Hypokalemia: Secondary | ICD-10-CM | POA: Diagnosis present

## 2020-04-10 DIAGNOSIS — Z66 Do not resuscitate: Secondary | ICD-10-CM | POA: Diagnosis present

## 2020-04-10 DIAGNOSIS — N183 Chronic kidney disease, stage 3 unspecified: Secondary | ICD-10-CM | POA: Diagnosis present

## 2020-04-10 DIAGNOSIS — R0689 Other abnormalities of breathing: Secondary | ICD-10-CM | POA: Diagnosis not present

## 2020-04-10 DIAGNOSIS — I739 Peripheral vascular disease, unspecified: Secondary | ICD-10-CM | POA: Diagnosis present

## 2020-04-10 DIAGNOSIS — R231 Pallor: Secondary | ICD-10-CM | POA: Diagnosis not present

## 2020-04-10 DIAGNOSIS — Z789 Other specified health status: Secondary | ICD-10-CM

## 2020-04-10 DIAGNOSIS — Z809 Family history of malignant neoplasm, unspecified: Secondary | ICD-10-CM

## 2020-04-10 DIAGNOSIS — J449 Chronic obstructive pulmonary disease, unspecified: Secondary | ICD-10-CM | POA: Diagnosis present

## 2020-04-10 DIAGNOSIS — I110 Hypertensive heart disease with heart failure: Secondary | ICD-10-CM | POA: Diagnosis not present

## 2020-04-10 DIAGNOSIS — Z951 Presence of aortocoronary bypass graft: Secondary | ICD-10-CM

## 2020-04-10 DIAGNOSIS — I4892 Unspecified atrial flutter: Secondary | ICD-10-CM | POA: Diagnosis present

## 2020-04-10 DIAGNOSIS — N1832 Chronic kidney disease, stage 3b: Secondary | ICD-10-CM | POA: Diagnosis not present

## 2020-04-10 DIAGNOSIS — Z79899 Other long term (current) drug therapy: Secondary | ICD-10-CM

## 2020-04-10 DIAGNOSIS — F1090 Alcohol use, unspecified, uncomplicated: Secondary | ICD-10-CM

## 2020-04-10 DIAGNOSIS — Z87891 Personal history of nicotine dependence: Secondary | ICD-10-CM | POA: Diagnosis not present

## 2020-04-10 DIAGNOSIS — Z8249 Family history of ischemic heart disease and other diseases of the circulatory system: Secondary | ICD-10-CM | POA: Diagnosis not present

## 2020-04-10 DIAGNOSIS — I480 Paroxysmal atrial fibrillation: Secondary | ICD-10-CM | POA: Diagnosis present

## 2020-04-10 DIAGNOSIS — I509 Heart failure, unspecified: Secondary | ICD-10-CM | POA: Diagnosis not present

## 2020-04-10 DIAGNOSIS — I2510 Atherosclerotic heart disease of native coronary artery without angina pectoris: Secondary | ICD-10-CM | POA: Diagnosis present

## 2020-04-10 DIAGNOSIS — I10 Essential (primary) hypertension: Secondary | ICD-10-CM | POA: Diagnosis not present

## 2020-04-10 DIAGNOSIS — J810 Acute pulmonary edema: Secondary | ICD-10-CM | POA: Diagnosis not present

## 2020-04-10 DIAGNOSIS — I5043 Acute on chronic combined systolic (congestive) and diastolic (congestive) heart failure: Secondary | ICD-10-CM | POA: Diagnosis present

## 2020-04-10 DIAGNOSIS — R0602 Shortness of breath: Secondary | ICD-10-CM | POA: Diagnosis not present

## 2020-04-10 DIAGNOSIS — J9601 Acute respiratory failure with hypoxia: Secondary | ICD-10-CM | POA: Diagnosis not present

## 2020-04-10 DIAGNOSIS — E785 Hyperlipidemia, unspecified: Secondary | ICD-10-CM | POA: Diagnosis present

## 2020-04-10 DIAGNOSIS — R0902 Hypoxemia: Secondary | ICD-10-CM | POA: Diagnosis not present

## 2020-04-10 DIAGNOSIS — Z7289 Other problems related to lifestyle: Secondary | ICD-10-CM | POA: Diagnosis not present

## 2020-04-10 DIAGNOSIS — Z7902 Long term (current) use of antithrombotics/antiplatelets: Secondary | ICD-10-CM

## 2020-04-10 DIAGNOSIS — D631 Anemia in chronic kidney disease: Secondary | ICD-10-CM | POA: Diagnosis present

## 2020-04-10 DIAGNOSIS — J90 Pleural effusion, not elsewhere classified: Secondary | ICD-10-CM | POA: Diagnosis not present

## 2020-04-10 DIAGNOSIS — I255 Ischemic cardiomyopathy: Secondary | ICD-10-CM | POA: Diagnosis present

## 2020-04-10 DIAGNOSIS — J9811 Atelectasis: Secondary | ICD-10-CM | POA: Diagnosis not present

## 2020-04-10 DIAGNOSIS — J811 Chronic pulmonary edema: Secondary | ICD-10-CM | POA: Diagnosis not present

## 2020-04-10 LAB — CBC WITH DIFFERENTIAL/PLATELET
Abs Immature Granulocytes: 0.05 10*3/uL (ref 0.00–0.07)
Basophils Absolute: 0.2 10*3/uL — ABNORMAL HIGH (ref 0.0–0.1)
Basophils Relative: 1 %
Eosinophils Absolute: 0.5 10*3/uL (ref 0.0–0.5)
Eosinophils Relative: 4 %
HCT: 41.2 % (ref 39.0–52.0)
Hemoglobin: 12 g/dL — ABNORMAL LOW (ref 13.0–17.0)
Immature Granulocytes: 0 %
Lymphocytes Relative: 27 %
Lymphs Abs: 3.2 10*3/uL (ref 0.7–4.0)
MCH: 28.4 pg (ref 26.0–34.0)
MCHC: 29.1 g/dL — ABNORMAL LOW (ref 30.0–36.0)
MCV: 97.4 fL (ref 80.0–100.0)
Monocytes Absolute: 1 10*3/uL (ref 0.1–1.0)
Monocytes Relative: 9 %
Neutro Abs: 7.1 10*3/uL (ref 1.7–7.7)
Neutrophils Relative %: 59 %
Platelets: 346 10*3/uL (ref 150–400)
RBC: 4.23 MIL/uL (ref 4.22–5.81)
RDW: 18.1 % — ABNORMAL HIGH (ref 11.5–15.5)
WBC: 12 10*3/uL — ABNORMAL HIGH (ref 4.0–10.5)
nRBC: 0 % (ref 0.0–0.2)

## 2020-04-10 LAB — LACTIC ACID, PLASMA
Lactic Acid, Venous: 1.9 mmol/L (ref 0.5–1.9)
Lactic Acid, Venous: 1.4 mmol/L (ref 0.5–1.9)

## 2020-04-10 LAB — GLUCOSE, CAPILLARY
Glucose-Capillary: 162 mg/dL — ABNORMAL HIGH (ref 70–99)
Glucose-Capillary: 84 mg/dL (ref 70–99)

## 2020-04-10 LAB — BASIC METABOLIC PANEL
Anion gap: 12 (ref 5–15)
BUN: 26 mg/dL — ABNORMAL HIGH (ref 8–23)
CO2: 20 mmol/L — ABNORMAL LOW (ref 22–32)
Calcium: 8.9 mg/dL (ref 8.9–10.3)
Chloride: 108 mmol/L (ref 98–111)
Creatinine, Ser: 1.46 mg/dL — ABNORMAL HIGH (ref 0.61–1.24)
GFR calc Af Amer: 49 mL/min — ABNORMAL LOW (ref >60)
GFR calc non Af Amer: 43 mL/min — ABNORMAL LOW (ref >60)
Glucose, Bld: 254 mg/dL — ABNORMAL HIGH (ref 70–99)
Potassium: 4.6 mmol/L (ref 3.5–5.1)
Sodium: 140 mmol/L (ref 135–145)

## 2020-04-10 LAB — TROPONIN I (HIGH SENSITIVITY)
Troponin I (High Sensitivity): 22 ng/L — ABNORMAL HIGH (ref <18)
Troponin I (High Sensitivity): 39 ng/L — ABNORMAL HIGH (ref <18)

## 2020-04-10 LAB — MAGNESIUM: Magnesium: 2.2 mg/dL (ref 1.7–2.4)

## 2020-04-10 LAB — SARS CORONAVIRUS 2 BY RT PCR (DIASORIN): SARS Coronavirus 2: NEGATIVE

## 2020-04-10 LAB — BRAIN NATRIURETIC PEPTIDE: B Natriuretic Peptide: 781.4 pg/mL — ABNORMAL HIGH (ref 0.0–100.0)

## 2020-04-10 LAB — PROCALCITONIN: Procalcitonin: <0.1 ng/mL

## 2020-04-10 MED ORDER — FUROSEMIDE 10 MG/ML IJ SOLN: 40.0000 mg | Freq: Two times a day (BID) | INTRAMUSCULAR | Status: DC

## 2020-04-10 MED ORDER — FOLIC ACID 1 MG PO TABS
1.0000 mg | ORAL_TABLET | Freq: Every day | ORAL | Status: DC
  Administered 2020-04-11 – 2020-04-12 (×2): 1 mg via ORAL
  Filled 2020-04-10 (×2): qty 1

## 2020-04-10 MED ORDER — NITROGLYCERIN 0.4 MG SL SUBL
SUBLINGUAL_TABLET | SUBLINGUAL | Status: AC
  Administered 2020-04-10: 0.4 mg via SUBLINGUAL
  Filled 2020-04-10: qty 1

## 2020-04-10 MED ORDER — CALCIUM POLYCARBOPHIL 625 MG PO TABS
625.0000 mg | ORAL_TABLET | Freq: Every day | ORAL | Status: DC
  Administered 2020-04-11: 625 mg via ORAL
  Filled 2020-04-10 (×4): qty 1

## 2020-04-10 MED ORDER — INSULIN ASPART 100 UNIT/ML ~~LOC~~ SOLN: 0.0000 [IU] | Freq: Three times a day (TID) | SUBCUTANEOUS | Status: DC

## 2020-04-10 MED ORDER — CARVEDILOL 6.25 MG PO TABS
6.2500 mg | ORAL_TABLET | Freq: Two times a day (BID) | ORAL | Status: DC
  Administered 2020-04-10 – 2020-04-12 (×5): 6.25 mg via ORAL
  Filled 2020-04-10: qty 1
  Filled 2020-04-10: qty 2
  Filled 2020-04-10 (×4): qty 1

## 2020-04-10 MED ORDER — CLOPIDOGREL BISULFATE 75 MG PO TABS
75.0000 mg | ORAL_TABLET | Freq: Every day | ORAL | Status: DC
  Administered 2020-04-10 – 2020-04-12 (×3): 75 mg via ORAL
  Filled 2020-04-10 (×3): qty 1

## 2020-04-10 MED ORDER — LORATADINE 10 MG PO TABS
10.0000 mg | ORAL_TABLET | Freq: Once | ORAL | Status: AC
  Administered 2020-04-10: 10 mg via ORAL
  Filled 2020-04-10: qty 1

## 2020-04-10 MED ORDER — ADULT MULTIVITAMIN W/MINERALS CH
1.0000 | ORAL_TABLET | Freq: Every day | ORAL | Status: DC
  Administered 2020-04-11 – 2020-04-12 (×2): 1 via ORAL
  Filled 2020-04-10 (×3): qty 1

## 2020-04-10 MED ORDER — LORAZEPAM 2 MG/ML IJ SOLN: 0.0000 mg | Freq: Two times a day (BID) | INTRAMUSCULAR | Status: DC

## 2020-04-10 MED ORDER — ONDANSETRON HCL 4 MG/2ML IJ SOLN: 4.0000 mg | Freq: Four times a day (QID) | INTRAMUSCULAR | Status: DC | PRN

## 2020-04-10 MED ORDER — IPRATROPIUM-ALBUTEROL 0.5-2.5 (3) MG/3ML IN SOLN: 3.0000 mL | Freq: Four times a day (QID) | RESPIRATORY_TRACT | Status: DC | PRN

## 2020-04-10 MED ORDER — FUROSEMIDE 10 MG/ML IJ SOLN
60.0000 mg | Freq: Once | INTRAMUSCULAR | Status: AC
  Administered 2020-04-10: 60 mg via INTRAVENOUS
  Filled 2020-04-10: qty 6

## 2020-04-10 MED ORDER — FUROSEMIDE 10 MG/ML IJ SOLN
40.0000 mg | Freq: Once | INTRAMUSCULAR | Status: AC
  Administered 2020-04-10: 40 mg via INTRAVENOUS
  Filled 2020-04-10: qty 4

## 2020-04-10 MED ORDER — ACETAMINOPHEN 325 MG PO TABS: 650.0000 mg | ORAL_TABLET | ORAL | Status: DC | PRN

## 2020-04-10 MED ORDER — NITROGLYCERIN IN D5W 200-5 MCG/ML-% IV SOLN: 0.0000 ug/min | INTRAVENOUS | Status: DC

## 2020-04-10 MED ORDER — SODIUM CHLORIDE 0.9 % IV SOLN: 250.0000 mL | INTRAVENOUS | Status: DC | PRN

## 2020-04-10 MED ORDER — THIAMINE HCL 100 MG/ML IJ SOLN
100.0000 mg | Freq: Every day | INTRAMUSCULAR | Status: DC
  Administered 2020-04-12: 100 mg via INTRAVENOUS
  Filled 2020-04-10 (×2): qty 2

## 2020-04-10 MED ORDER — NITROGLYCERIN IN D5W 200-5 MCG/ML-% IV SOLN
INTRAVENOUS | Status: AC
  Administered 2020-04-10: 50 mg
  Filled 2020-04-10: qty 250

## 2020-04-10 MED ORDER — LORAZEPAM 1 MG PO TABS: 1.0000 mg | ORAL_TABLET | ORAL | Status: DC | PRN

## 2020-04-10 MED ORDER — THIAMINE HCL 100 MG PO TABS
100.0000 mg | ORAL_TABLET | Freq: Every day | ORAL | Status: DC
  Administered 2020-04-10 – 2020-04-11 (×2): 100 mg via ORAL
  Filled 2020-04-10 (×3): qty 1

## 2020-04-10 MED ORDER — POLYSACCHARIDE IRON COMPLEX 150 MG PO CAPS
150.0000 mg | ORAL_CAPSULE | Freq: Every day | ORAL | Status: DC
  Administered 2020-04-10 – 2020-04-11 (×2): 150 mg via ORAL
  Filled 2020-04-10 (×4): qty 1

## 2020-04-10 MED ORDER — SODIUM CHLORIDE 0.9% FLUSH
3.0000 mL | INTRAVENOUS | Status: DC | PRN
  Administered 2020-04-12: 3 mL via INTRAVENOUS

## 2020-04-10 MED ORDER — INSULIN ASPART 100 UNIT/ML ~~LOC~~ SOLN: 0.0000 [IU] | Freq: Every day | SUBCUTANEOUS | Status: DC

## 2020-04-10 MED ORDER — FUROSEMIDE 40 MG PO TABS: 40.0000 mg | ORAL_TABLET | Freq: Every day | ORAL | Status: DC

## 2020-04-10 MED ORDER — SIMVASTATIN 20 MG PO TABS
20.0000 mg | ORAL_TABLET | Freq: Every day | ORAL | Status: DC
  Administered 2020-04-10 – 2020-04-12 (×3): 20 mg via ORAL
  Filled 2020-04-10 (×3): qty 1

## 2020-04-10 MED ORDER — NITROGLYCERIN 0.4 MG SL SUBL: 0.4000 mg | SUBLINGUAL_TABLET | SUBLINGUAL | Status: DC | PRN

## 2020-04-10 MED ORDER — SODIUM CHLORIDE 0.9% FLUSH
3.0000 mL | Freq: Two times a day (BID) | INTRAVENOUS | Status: DC
  Administered 2020-04-10 – 2020-04-11 (×3): 3 mL via INTRAVENOUS

## 2020-04-10 MED ORDER — LORAZEPAM 2 MG/ML IJ SOLN: 0.0000 mg | Freq: Four times a day (QID) | INTRAMUSCULAR | Status: DC

## 2020-04-10 MED ORDER — ENOXAPARIN SODIUM 40 MG/0.4ML ~~LOC~~ SOLN
40.0000 mg | SUBCUTANEOUS | Status: DC
  Administered 2020-04-10 – 2020-04-11 (×2): 40 mg via SUBCUTANEOUS
  Filled 2020-04-10 (×2): qty 0.4

## 2020-04-10 MED ORDER — LORAZEPAM 2 MG/ML IJ SOLN: 1.0000 mg | INTRAMUSCULAR | Status: DC | PRN

## 2020-04-10 NOTE — H&P (Addendum)
History and Physical    Dean Bean GUR:[id number] DOB: 1931/07/24 DOA: 04/10/2020  Referring MD/NP/PA: Madalyn Rob, MD PCP: Harlan Stains, MD   Patient coming from: Home via EMS  Chief Complaint: Could not breathe  I have personally briefly reviewed patient's old medical records in [place]   HPI: Dean Bean is a 86 y.o. male with medical history significant of hypertension, CAD s/p 4V CABG in 2013 and stenting to left main into the diagonal branch in 2831, systolic CHF, atrial fibrillation/flutter status post ablation, chronic kidney disease stage III presents with acute shortness of breath starting this morning around 4 AM.  States that he was awakened out of his sleep unable to catch his breath.  Reports having somewhat of a dry cough, but denies any chest pain.  Noted having a similar episode at the end of May while he was in [place] requiring hospitalization.  He is followed by Dr. Angelena Form of cardiology in the outpatient setting.  He reports being instructed that a decreased dose and had been alternating furosemide 40 milligrams and 20 mg intermittently.  He admits that he has had leg swelling that usually goes away at night and head and small increase in his weight.  ED Course: Upon admission into the emergency department patient was seen to be afebrile, pulse 115, respiration 42, blood pressure 168/95, and O2 saturations maintained on BiPAP currently.  Labs significant for WBC 12, hemoglobin 12, BUN 26, creatinine 1.46, and glucose 254.  X-ray revealed chronic interstitial prominence with probable atypical infection.  Patient had been given 60 mg of furosemide and nitroglycerin.    Review of Systems  Constitutional: Negative for fever and malaise/fatigue.       Positive for weight gain  HENT: Negative for ear discharge and nosebleeds.   Eyes: Negative for photophobia and pain.  Respiratory: Positive for cough and shortness of breath. Negative for sputum  production.   Cardiovascular: Positive for leg swelling. Negative for chest pain.  Gastrointestinal: Negative for abdominal pain, diarrhea, nausea and vomiting.  Genitourinary: Negative for dysuria and flank pain.  Musculoskeletal: Negative for joint pain and myalgias.  Skin: Negative for rash.  Neurological: Negative for focal weakness and loss of consciousness.  Psychiatric/Behavioral: Negative for substance abuse. The patient has insomnia.     Past Medical History:  Diagnosis Date  . Atrial fibrillation ([place])   . CAD (coronary artery disease)    s/p CABG  . COPD (chronic obstructive pulmonary disease) ([place])   . HTN (hypertension)   . Ischemic cardiomyopathy   . Mitral regurgitation   . Systolic CHF [facility])     Past Surgical History:  Procedure Laterality Date  . CORONARY ARTERY BYPASS GRAFT     4Vessel     reports that he quit smoking about 23 years ago. His smoking use included cigarettes. He has a 50.00 pack-year smoking history. He has never used smokeless tobacco. He reports current alcohol use of about 7.0 standard drinks of alcohol per week. He reports that he does not use drugs.  No Known Allergies  Family History  Problem Relation Age of Onset  . Cancer Mother   . Heart attack Father 36    Prior to Admission medications   Medication Sig Start Date End Date Taking? Authorizing Provider  Ascorbic Acid (VITAMIN C) 1000 MG tablet Take 1,000 mg by mouth daily.   Yes [provider]  carvedilol (COREG) 6.25 MG tablet Take 6.25 mg by mouth 2 (two) times daily. 07/20/16  Yes [provider]  clopidogrel (PLAVIX) 75 MG tablet Take 75 mg by mouth daily. 07/15/16  Yes [provider]  furosemide (LASIX) 40 MG tablet Take 40 mg by mouth daily.  02/12/20  Yes [provider]  POLY-IRON 150 150 MG capsule Take 150 mg by mouth at bedtime. 04/04/20  Yes [provider]  polycarbophil (FIBERCON) 625 MG tablet Take 625 mg by mouth daily.    Yes [provider]  simvastatin (ZOCOR) 20 MG tablet Take 20 mg by mouth daily. 07/20/16  Yes [provider]  Dextromethorphan-guaiFENesin (CORICIDIN HBP CONGESTION/COUGH PO) Take 1 tablet by mouth as needed. Patient not taking: Reported on 04/10/2020    [provider]    Physical Exam:  Constitutional: Elderly male currently in no acute distress Vitals:   04/10/20 0748 04/10/20 0806  BP: (!) 168/95   Pulse: (!) 115   Resp: (!) 42   SpO2: 100%   Weight:  69.4 kg  Height:  5\' 9"  (1.753 m)   Eyes: PERRL, lids and conjunctivae normal ENMT: Mucous membranes are moist. Posterior pharynx clear of any exudate or lesions.  Neck: normal, supple, no masses, no thyromegaly.  Mild JVD appreciated. Respiratory: Respiratory rate improved currently on nasal cannula oxygen off of BiPAP with O2 saturations maintained.  Mild crackles appreciated in bilateral lower lung fields. Cardiovascular: Regular rate and rhythm, no murmurs / rubs / gallops.  Trace lower extremity edema. 2+ pedal pulses. No carotid bruits.  Abdomen: no tenderness, no masses palpated. No hepatosplenomegaly. Bowel sounds positive.  Musculoskeletal: no clubbing / cyanosis. No joint deformity upper and lower extremities. Good ROM, no contractures. Normal muscle tone.  Skin: no rashes, lesions, ulcers. No induration Neurologic: CN 2-12 grossly intact. Sensation intact, DTR normal. Strength 5/5 in all 4.  Psychiatric: Normal judgment and insight. Alert and oriented x 3. Normal mood.     Labs on Admission: I have personally reviewed following labs and imaging studies  CBC: Recent Labs  Lab 04/10/20 0756  WBC 12.0*  NEUTROABS 7.1  HGB 12.0*  HCT 41.2  MCV 97.4  PLT 161   Basic Metabolic Panel: Recent Labs  Lab 04/10/20 0756  NA 140  K 4.6  CL 108  CO2 20*  GLUCOSE 254*  BUN 26*  CREATININE 1.46*  CALCIUM 8.9  MG 2.2   GFR: Estimated Creatinine Clearance: 35 mL/min (A) (by C-G formula  based on SCr of 1.46 mg/dL (H)). Liver Function Tests: No results for input(s): AST, ALT, ALKPHOS, BILITOT, PROT, ALBUMIN in the last 168 hours. No results for input(s): LIPASE, AMYLASE in the last 168 hours. No results for input(s): AMMONIA in the last 168 hours. Coagulation Profile: No results for input(s): INR, PROTIME in the last 168 hours. Cardiac Enzymes: No results for input(s): CKTOTAL, CKMB, CKMBINDEX, TROPONINI in the last 168 hours. BNP (last 3 results) No results for input(s): PROBNP in the last 8760 hours. HbA1C: No results for input(s): HGBA1C in the last 72 hours. CBG: No results for input(s): GLUCAP in the last 168 hours. Lipid Profile: No results for input(s): CHOL, HDL, LDLCALC, TRIG, CHOLHDL, LDLDIRECT in the last 72 hours. Thyroid Function Tests: No results for input(s): TSH, T4TOTAL, FREET4, T3FREE, THYROIDAB in the last 72 hours. Anemia Panel: No results for input(s): VITAMINB12, FOLATE, FERRITIN, TIBC, IRON, RETICCTPCT in the last 72 hours. Urine analysis:    Component Value Date/Time   COLORURINE YELLOW 09/08/2019 0914   APPEARANCEUR CLEAR 09/08/2019 0914   LABSPEC 1.025 09/08/2019  [place] 6.0 09/08/2019 0914   GLUCOSEU NEGATIVE 09/08/2019 0914   HGBUR TRACE (A) 09/08/2019 0914   BILIRUBINUR NEGATIVE 09/08/2019 0914   KETONESUR NEGATIVE 09/08/2019 0914   PROTEINUR NEGATIVE 09/08/2019 0914   NITRITE NEGATIVE 09/08/2019 0914   LEUKOCYTESUR NEGATIVE 09/08/2019 0914   Sepsis Labs: No results found for this or any previous visit (from the past 240 hour(s)).   Radiological Exams on Admission: DG Chest Portable 1 View  Result Date: 04/10/2020 CLINICAL DATA:  Shortness of breath EXAM: PORTABLE CHEST 1 VIEW COMPARISON:  09/08/2019 FINDINGS: Interstitial prominence. Patchy atelectasis at lung bases. No significant pleural effusion. No pneumothorax. Similar cardiomediastinal contours with normal heart size. IMPRESSION: Chronic interstitial prominence with  probable superimposed mild edema or atypical pneumonia. Electronically Signed   By: Macy Mis M.D.   On: 04/10/2020 08:21    EKG: Independently reviewed.  Sinus tachycardia 122 bpm with anterior ST elevations  Assessment/Plan Acute respiratory failure with hypoxia: Patient presented with acute shortness of breath on CPAP.  Patient was switched to BiPAP in the ED.  Chest x-ray with chronic interstitial prominence with probable superimposed edema or atypical infection.   -Admit to a progressive bed -Continuous pulse oximetry with nasal cannula oxygen -BiPAP as needed   -Wean to room air as tolerated  Diastolic congestive heart failure: Acute on chronic.  Respiratory distress was improved after being placed on BiPAP.  BNP elevated at 781.4.  Echocardiogram recently obtained on 7/20 noted EF of 50- 55% with grade 2 diastolic dysfunction and mild to moderate mitral regurgitation.  At home patient had been alternating furosemide 20 and 40 mg daily. -Strict intake and output -Heart failure order set utilized -Daily weights -Given additional dose of Lasix 40 mg IV x1 dose tonight -Discussed case with Dr. Angelena Form through secure chat and he recommended having patient on Lasix 40 mg daily which has been ordered to restart in a.m. Dr. Angelena Form recommended formally consulting cardiology if needed.   SIRS: On admission patient was found to be tachycardic and tachypneic with WBC elevated at 12.  Clinically symptoms appear more likely reactive than infectious in nature. -Check lactic acid level -Recheck CBC in am  Hyperglycemia: Acute. Initial glucose elevated to 259.  Patient without prior history of diabetes.  Suspect symptoms could possibly be reactive in nature. -Hypoglycemic protocol -Check hemoglobin A1c -CBGs before every meal with sensitive SSI -Consider discontinuing if blood sugars noted to be within normal limits  Elevated troponin: Acute.  High-sensitivity troponin mildly elevated at  22.  Suspect this is secondary to demand. -Continue to trend cardiac troponin   CAD/ischemic cardiomyopathy: Patient with prior history of CABG in 2013 and subsequent stent to left main into the diagonal branch in 2016. -Continue Plavix, beta-blocker, Imdur, and statin  History of atrial fibrillation/flutter: Patient status post ablation without any known recurrence of atrial fibrillation/flutter for which he has not been on anticoagulation. -Continue Coreg  Chronic kidney disease stage IIIb: Creatinine 1.46 which appears near patient's baseline. -Continue to monitor kidney function with diuresis  Normocytic anemia: Hemoglobin 12.0 g/dL which appears near patient's baseline.  No reports of bleeding. -Continue to monitor  Mitral regurgitation: Mitral regurgitation noted to be mild to moderate on recent echocardiogram. -Continue outpatient follow-up  Essential hypertension: Home medications include Coreg 6.25 mg twice daily and furosemide 40 mg daily. -Continue Coreg as tolerated  Peripheral artery disease: Noninvasive studies in [facility] in 2014 with ABIs of 0.76 on the left and 0.46 on the  right.  Patient had declined repeating ABI testing during his last office visit in June of this year.  Hyperlipidemia: Home medications include simvastatin 20 mg daily. -Continue simvastatin  Remote history of tobacco use: Reported quitting over 20 years ago.  DNR: Present on admission  Alcohol Use: Patient normally 1-2 glasses of wine per night with dinner.  No history of withdrawal. -CIWA protocols initiated  DVT prophylaxis: lovenox Code Status: DNR  Family Communication: daughter updated over phone Disposition Plan: Possible discharge home in 1 to 2 days Consults called: Cardiology Admission status: Inpatient   Norval Morton MD [facility] Pager [phone]   If 7PM-7AM, please contact night-coverage [email or web address] Password [facility]  04/10/2020, 9:32 AM

## 2020-04-10 NOTE — ED Triage Notes (Signed)
Pt bib ems from home resp distress. Per ems, pt became shob last night. On scene pt in tripod position with saturations of 80%. Pt arrives with CPAP in place with some improvement. Pt oxygen 87% on CPAP. HR 120's ST. 180/100. EMS also reports +2 pedal edema with rales in all lung fields. Pt given 3 sl Nitroglycerin tablets. 18g LAC.

## 2020-04-10 NOTE — ED Provider Notes (Signed)
[facility] EMERGENCY DEPARTMENT Provider Note   CSN: [id number] Arrival date & time: 04/10/20  4268     History Chief Complaint  Patient presents with  . Respiratory Distress    Dean Bean is a 86 y.o. male.    Level 5 caveat history limited due to acuity.  Additional history obtained via discussion with daughter at bedside.  Additional history obtained via chart review, care everywhere.  Presents to ER with respiratory distress.  Patient had no symptoms yesterday, this morning developed sudden difficulty breathing.  No associated chest pain, chest tightness.  No cough or fever.  Feels similar to episode inferiorly as well as episode around Christmas last year.   Recent admission to hospital in [place]  HPI     Past Medical History:  Diagnosis Date  . Atrial fibrillation ([place])   . CAD (coronary artery disease)    s/p CABG  . COPD (chronic obstructive pulmonary disease) ([place])   . HTN (hypertension)   . Ischemic cardiomyopathy   . Mitral regurgitation   . Systolic CHF [facility])     Patient Active Problem List   Diagnosis Date Noted  . Hx of CABG 2013 [place] 09/09/2019  . Acute respiratory failure ([place]) 09/08/2019  . Hypertensive emergency 09/08/2019  . Acute on chronic systolic CHF (congestive heart failure) ([place]) 12/19/2016  . Acute respiratory failure with hypoxia ([place]) 12/19/2016  . Pulmonary nodule 12/19/2016  . HTN (hypertension)   . CAD (coronary artery disease) CABG 2013 and PCI stent 2015   . Systolic CHF ([place])   . COPD (chronic obstructive pulmonary disease) ([place])   . Atrial fibrillation ([place])   . Ischemic cardiomyopathy   . Mitral regurgitation     Past Surgical History:  Procedure Laterality Date  . CORONARY ARTERY BYPASS GRAFT     4Vessel       Family History  Problem Relation Age of Onset  . Cancer Mother   . Heart attack Father 25    Social History   Tobacco Use  . Smoking status: Former Smoker     Packs/day: 1.00    Years: 50.00    Pack years: 50.00    Types: Cigarettes    Quit date: 10/02/1996    Years since quitting: 23.5  . Smokeless tobacco: Never Used  Vaping Use  . Vaping Use: Never used  Substance Use Topics  . Alcohol use: Yes    Alcohol/week: 7.0 standard drinks    Types: 7 Glasses of wine per week  . Drug use: No    Home Medications Prior to Admission medications   Medication Sig Start Date End Date Taking? Authorizing Provider  Ascorbic Acid (VITAMIN C) 1000 MG tablet Take 1,000 mg by mouth daily.   Yes [provider]  carvedilol (COREG) 6.25 MG tablet Take 6.25 mg by mouth 2 (two) times daily. 07/20/16  Yes [provider]  clopidogrel (PLAVIX) 75 MG tablet Take 75 mg by mouth daily. 07/15/16  Yes [provider]  furosemide (LASIX) 40 MG tablet Take 40 mg by mouth daily.  02/12/20  Yes [provider]  POLY-IRON 150 150 MG capsule Take 150 mg by mouth at bedtime. 04/04/20  Yes [provider]  polycarbophil (FIBERCON) 625 MG tablet Take 625 mg by mouth daily.   Yes [provider]  simvastatin (ZOCOR) 20 MG tablet Take 20 mg by mouth daily. 07/20/16  Yes [provider]  Dextromethorphan-guaiFENesin (CORICIDIN HBP CONGESTION/COUGH PO) Take 1 tablet by  mouth as needed. Patient not taking: Reported on 04/10/2020    [provider]    Allergies    Patient has no known allergies.  Review of Systems   Review of Systems  Unable to perform ROS: Acuity of condition    Physical Exam Updated Vital Signs BP (!) 102/63   Pulse 85   Temp (!) 97.5 F (36.4 C) (Temporal)   Resp 17   Ht 5\' 9"  (1.753 m)   Wt 69.4 kg   SpO2 100%   BMI 22.59 kg/m   Physical Exam Vitals and nursing note reviewed.  Constitutional:      Appearance: He is well-developed.  HENT:     Head: Normocephalic and atraumatic.  Eyes:     Conjunctiva/sclera: Conjunctivae normal.  Cardiovascular:     Rate and Rhythm:  Regular rhythm. Tachycardia present.     Heart sounds: No murmur heard.   Pulmonary:     Comments: On EMS CPAP on arrival, significant labored breathing, speaks in short phrases, diminished but present breath sounds b/l Abdominal:     Palpations: Abdomen is soft.     Tenderness: There is no abdominal tenderness.  Musculoskeletal:        General: Swelling present. No signs of injury.     Cervical back: Neck supple.     Comments: 1+ pitting edema in lower legs b/l  Skin:    General: Skin is warm and dry.     Capillary Refill: Capillary refill takes less than 2 seconds.  Neurological:     General: No focal deficit present.     Mental Status: He is alert.  Psychiatric:        Mood and Affect: Mood normal.        Behavior: Behavior normal.     ED Results / Procedures / Treatments   Labs (all labs ordered are listed, but only abnormal results are displayed) Labs Reviewed  CBC WITH DIFFERENTIAL/PLATELET - Abnormal; Notable for the following components:      Result Value   WBC 12.0 (*)    Hemoglobin 12.0 (*)    MCHC 29.1 (*)    RDW 18.1 (*)    Basophils Absolute 0.2 (*)    All other components within normal limits  BASIC METABOLIC PANEL - Abnormal; Notable for the following components:   CO2 20 (*)    Glucose, Bld 254 (*)    BUN 26 (*)    Creatinine, Ser 1.46 (*)    GFR calc non Af Amer 43 (*)    GFR calc Af Amer 49 (*)    All other components within normal limits  BRAIN NATRIURETIC PEPTIDE - Abnormal; Notable for the following components:   B Natriuretic Peptide 781.4 (*)    All other components within normal limits  TROPONIN I (HIGH SENSITIVITY) - Abnormal; Notable for the following components:   Troponin I (High Sensitivity) 22 (*)    All other components within normal limits  SARS CORONAVIRUS 2 BY RT PCR (DIASORIN)  MAGNESIUM  PROCALCITONIN  LACTIC ACID, PLASMA  LACTIC ACID, PLASMA  TROPONIN I (HIGH SENSITIVITY)    EKG None  Radiology DG Chest Portable 1  View  Result Date: 04/10/2020 CLINICAL DATA:  Shortness of breath EXAM: PORTABLE CHEST 1 VIEW COMPARISON:  09/08/2019 FINDINGS: Interstitial prominence. Patchy atelectasis at lung bases. No significant pleural effusion. No pneumothorax. Similar cardiomediastinal contours with normal heart size. IMPRESSION: Chronic interstitial prominence with probable superimposed mild edema or atypical pneumonia. Electronically Signed  By: Macy Mis M.D.   On: 04/10/2020 08:21    Procedures .Critical Care Performed by: Lucrezia Starch, MD Authorized by: Lucrezia Starch, MD   Critical care provider statement:    Critical care time (minutes):  45   Critical care was necessary to treat or prevent imminent or life-threatening deterioration of the following conditions:  Cardiac failure and respiratory failure   Critical care was time spent personally by me on the following activities:  Discussions with consultants, evaluation of patient's response to treatment, examination of patient, ordering and performing treatments and interventions, ordering and review of laboratory studies, ordering and review of radiographic studies, pulse oximetry, re-evaluation of patient's condition, obtaining history from patient or surrogate and review of old charts   (including critical care time)  Medications Ordered in ED Medications  nitroGLYCERIN (NITROSTAT) SL tablet 0.4 mg (0.4 mg Sublingual Given 04/10/20 0753)  furosemide (LASIX) injection 60 mg (has no administration in time range)  carvedilol (COREG) tablet 6.25 mg (has no administration in time range)  clopidogrel (PLAVIX) tablet 75 mg (has no administration in time range)  simvastatin (ZOCOR) tablet 20 mg (has no administration in time range)  iron polysaccharides (NIFEREX) capsule 150 mg (has no administration in time range)  polycarbophil (FIBERCON) tablet 625 mg (has no administration in time range)  sodium chloride flush (NS) 0.9 % injection 3 mL (has no  administration in time range)  sodium chloride flush (NS) 0.9 % injection 3 mL (has no administration in time range)  0.9 %  sodium chloride infusion (has no administration in time range)  acetaminophen (TYLENOL) tablet 650 mg (has no administration in time range)  ondansetron (ZOFRAN) injection 4 mg (has no administration in time range)  enoxaparin (LOVENOX) injection 40 mg (has no administration in time range)  furosemide (LASIX) injection 40 mg (has no administration in time range)  ipratropium-albuterol (DUONEB) 0.5-2.5 (3) MG/3ML nebulizer solution 3 mL (has no administration in time range)  nitroGLYCERIN 0.2 mg/mL in dextrose 5 % infusion (10 mcg/min  Rate/Dose Change 04/10/20 1012)    ED Course  I have reviewed the triage vital signs and the nursing notes.  Pertinent labs & imaging results that were available during my care of the patient were reviewed by me and considered in my medical decision making (see chart for details).  Clinical Course as of Apr 10 1049  Wed Apr 10, 2020  0920 Updated family   [RD]  0929 Tamala Julian will admit   [RD]    Clinical Course User Index [RD] Lucrezia Starch, MD   MDM Rules/Calculators/A&P                          86 year old male presents to ER with respiratory distress.  On initial arrival, patient on EMS CPAP machine.  Noted crackles and diminished breath sounds bilaterally.  Significant tachypnea.  Concern for possible flash pulmonary edema.  Transition to our BiPAP, started nitroglycerin drip.  Patient had marked improvement in respiratory status.  Provided Lasix for diuresis.  Lab work notable for very minimal elevation in troponin level, significant elevation in BNP.  Suspect this is related to heart failure.  Will admit to hospitalist service for further management.  Regarding CODE STATUS, patient said and daughter confirmed he wants to be DNR.    Dr. Tamala Julian will admit.  Final Clinical Impression(s) / ED Diagnoses Final diagnoses:  Flash  pulmonary edema (HCC)  Acute on chronic heart failure, unspecified  heart failure type ([place])  Acute respiratory failure with hypoxia [facility])    Rx / DC Orders ED Discharge Orders    None       Lucrezia Starch, MD 04/10/20 1050

## 2020-04-10 NOTE — ED Notes (Addendum)
Pt weaned off of BIPAP. Started at 6 lpm o2 via [place] and maintained 100% SPO2, pt has been weaned down spo2 to 2.5 lpm o2 via [place] w/ spo2 96% at this time. Will continue to monitor.

## 2020-04-10 NOTE — Progress Notes (Signed)
Dean Bean [id number] Admission Data: 04/10/2020 6:29 PM Attending Provider: Norval Morton, MD  ASU:ORVIF, Caren Griffins, MD Consults/ Treatment Team:   Dean Bean is a 86 y.o. male patient admitted from ED awake, alert  & orientated  X 3,  DNR, VSS - Blood pressure (!) 145/73, pulse 85, temperature 97.6 F (36.4 C), temperature source Oral, resp. rate 21, height 5\' 9"  (1.753 m), weight 64.6 kg, SpO2 93 %., O2     nasal cannular, no c/o shortness of breath, no c/o chest pain, no distress noted. Tele # [id number] placed and pt is currently running:normal sinus rhythm.   IV site WDL:  antecubital left, condition patent and no redness with a transparent dsg that's clean dry and intact.     Pt orientation to unit, room and routine. Information packet given to patient/family and safety video watched.  Admission INP armband ID verified with patient/family, and in place. SR up x 2, fall risk assessment complete with Patient and family verbalizing understanding of risks associated with falls. Pt verbalizes an understanding of how to use the call bell and to call for help before getting out of bed.  Skin, clean-dry- intact without evidence of bruising, or skin tears.   No evidence of skin break down noted on exam.     Will cont to monitor and assist as needed.  Hosie Spangle, [place] 04/10/2020 6:29 PM

## 2020-04-10 NOTE — ED Notes (Signed)
Pt weaned off O2, spo2 maintaining 93-97% on RA

## 2020-04-10 NOTE — ED Notes (Signed)
Pt removed from bi-pap.  Sats remain 100% on 4 L with rr of 20.

## 2020-04-10 NOTE — Progress Notes (Signed)
Patient arrived via EMS on CPAP.  Once arrived, patient placed on bipap on IPAP of 18, and EPAP of 8 and 60%.  Patient states that mask is helping at this time and that breathing is feeling slightly better.  RT will continue to monitor.

## 2020-04-11 DIAGNOSIS — I509 Heart failure, unspecified: Secondary | ICD-10-CM

## 2020-04-11 LAB — PHOSPHORUS: Phosphorus: 3.4 mg/dL (ref 2.5–4.6)

## 2020-04-11 LAB — GLUCOSE, CAPILLARY
Glucose-Capillary: 88 mg/dL (ref 70–99)
Glucose-Capillary: 86 mg/dL (ref 70–99)
Glucose-Capillary: 74 mg/dL (ref 70–99)
Glucose-Capillary: 122 mg/dL — ABNORMAL HIGH (ref 70–99)

## 2020-04-11 LAB — BASIC METABOLIC PANEL
Anion gap: 12 (ref 5–15)
BUN: 27 mg/dL — ABNORMAL HIGH (ref 8–23)
CO2: 23 mmol/L (ref 22–32)
Calcium: 9 mg/dL (ref 8.9–10.3)
Chloride: 105 mmol/L (ref 98–111)
Creatinine, Ser: 1.46 mg/dL — ABNORMAL HIGH (ref 0.61–1.24)
GFR calc Af Amer: 49 mL/min — ABNORMAL LOW (ref >60)
GFR calc non Af Amer: 43 mL/min — ABNORMAL LOW (ref >60)
Glucose, Bld: 93 mg/dL (ref 70–99)
Potassium: 3.4 mmol/L — ABNORMAL LOW (ref 3.5–5.1)
Sodium: 140 mmol/L (ref 135–145)

## 2020-04-11 LAB — HEMOGLOBIN A1C
Hgb A1c MFr Bld: 5.1 % (ref 4.8–5.6)
Mean Plasma Glucose: 99.67 mg/dL

## 2020-04-11 LAB — MAGNESIUM: Magnesium: 2 mg/dL (ref 1.7–2.4)

## 2020-04-11 MED ORDER — FUROSEMIDE 10 MG/ML IJ SOLN
40.0000 mg | Freq: Two times a day (BID) | INTRAMUSCULAR | Status: DC
  Administered 2020-04-11 – 2020-04-12 (×3): 40 mg via INTRAVENOUS
  Filled 2020-04-11 (×3): qty 4

## 2020-04-11 MED ORDER — HYDROXYZINE HCL 10 MG PO TABS
10.0000 mg | ORAL_TABLET | Freq: Three times a day (TID) | ORAL | Status: DC | PRN
  Administered 2020-04-11: 10 mg via ORAL
  Filled 2020-04-11: qty 1

## 2020-04-11 MED ORDER — POTASSIUM CHLORIDE CRYS ER 20 MEQ PO TBCR
40.0000 meq | EXTENDED_RELEASE_TABLET | Freq: Every day | ORAL | Status: DC
  Administered 2020-04-11 – 2020-04-12 (×2): 40 meq via ORAL
  Filled 2020-04-11 (×2): qty 2

## 2020-04-11 MED ORDER — POLYETHYLENE GLYCOL 3350 17 G PO PACK
17.0000 g | PACK | Freq: Every day | ORAL | Status: DC
  Administered 2020-04-11 – 2020-04-12 (×2): 17 g via ORAL
  Filled 2020-04-11 (×2): qty 1

## 2020-04-11 NOTE — Progress Notes (Signed)
Patient was placed on Bipap yesterday morning.  Patient has a history of CHF.  Patient is currently on RA with an O2 sat of 96%.  Made Bipap order PRN if patient needs it.

## 2020-04-11 NOTE — Progress Notes (Signed)
PROGRESS NOTE        PATIENT DETAILS Name: Dean Bean Age: 86 y.o. Sex: male Date of Birth: 11/16/1930 Admit Date: 04/10/2020 Admitting Physician Norval Morton, MD [id number], Caren Griffins, MD  Brief Narrative: Patient is a 86 y.o. male with past medical history of CAD s/p CABG 2013-s/p PCI in [id number] diastolic heart failure, chronic kidney disease stage IIIb who presented with shortness of breath-found to have acute hypoxic respiratory failure due to decompensated diastolic heart failure.  See below for further details.  Significant events: 7/28>> admit for hypoxia requiring BiPAP due to decompensated heart failure  Significant studies: 7/28>> chest x-ray: Chronic interstitial prominence with probable superimposed mild edema 7/20>> echo: EF 93-81%, grade 2 diastolic dysfunction, mild to moderate MR  Antimicrobial therapy: None  Microbiology data: None  Procedures : None  Consults: None  DVT Prophylaxis : enoxaparin (LOVENOX) injection 40 mg Start: 04/10/20 1200   Subjective: Feels much better-liberated off BiPAP yesterday evening-currently on room air.  Still with significant lower extremity edema.  Assessment/Plan: Acute hypoxic respiratory failure: Secondary to decompensated diastolic heart failure-required BiPAP on initial presentation-currently on room air.  Follow.  Decompensated diastolic heart failure: Volume status markedly better compared to yesterday-breathing better as well-still with 2+ pitting edema-continue IV diuretics.  Follow weights, electrolytes, intake/output.  Minimally elevated troponins: Not consistent with ACS-trend is flat-doubt any significance-suspect no further work-up is required  Hypokalemia: Replete and recheck.  CKD stage IIIb: Creatinine not far from baseline-follow periodically.  Normocytic anemia: Hemoglobin stable-no active evidence of GI loss-follow periodically.  HTN: BP stable-continue Coreg  and diuretics.  CAD: No anginal symptoms-remains on Plavix, beta-blocker, Imdur and statin.  PAF s/p ablation: Continue Coreg-Per outpatient cardiology notes-not on long-term anticoagulation since he has no recurrence of AF following ablation.  HLD: Continue statin  PAD: Stable for outpatient follow-up with his outpatient physicians.  Alcohol use: Drinks 1-2 glasses of wine per night with dinner-no signs of withdrawal.  Follow.   Diet: Diet Order            Diet Heart Room service appropriate? Yes; Fluid consistency: Thin; Fluid restriction: 2000 mL Fluid  Diet effective now                  Code Status: DNR  Family Communication: Spoke with daughter over phone  Disposition Plan: Status is: Inpatient  Remains inpatient appropriate because:Inpatient level of care appropriate due to severity of illness  Dispo: The patient is from: Home              Anticipated d/c is to: Home              Anticipated d/c date is: 1 day on 7/30              Patient currently is not medically stable to d/c.   Barriers to Discharge:  Antimicrobial agents: Anti-infectives (From admission, onward)   None       Time spent: 25 minutes-Greater than 50% of this time was spent in counseling, explanation of diagnosis, planning of further management, and coordination of care.  MEDICATIONS: Scheduled Meds: . carvedilol  6.25 mg Oral BID  . clopidogrel  75 mg Oral Daily  . enoxaparin (LOVENOX) injection  40 mg Subcutaneous Q24H  . folic acid  1 mg Oral Daily  . furosemide  40 mg Intravenous BID  .  insulin aspart  0-5 Units Subcutaneous QHS  . insulin aspart  0-9 Units Subcutaneous TID WC  . iron polysaccharides  150 mg Oral QHS  . LORazepam  0-4 mg Intravenous Q6H   Followed by  . [START ON 04/12/2020] LORazepam  0-4 mg Intravenous Q12H  . multivitamin with minerals  1 tablet Oral Daily  . polycarbophil  625 mg Oral QHS  . polyethylene glycol  17 g Oral Daily  . potassium chloride   40 mEq Oral Daily  . simvastatin  20 mg Oral Daily  . sodium chloride flush  3 mL Intravenous Q12H  . thiamine  100 mg Oral Daily   Or  . thiamine  100 mg Intravenous Daily   Continuous Infusions: . sodium chloride    . nitroGLYCERIN Stopped (04/10/20 1130)   PRN Meds:.sodium chloride, acetaminophen, hydrOXYzine, ipratropium-albuterol, LORazepam **OR** LORazepam, nitroGLYCERIN, ondansetron (ZOFRAN) IV, sodium chloride flush   PHYSICAL EXAM: Vital signs: Vitals:   04/10/20 2000 04/11/20 0011 04/11/20 0413 04/11/20 0738  BP: (!) 133/68 (!) 109/53 (!) 138/61   Pulse: 76 90 71   Resp: 22 19 17    Temp: 98.1 F (36.7 C) 97.9 F (36.6 C) 97.7 F (36.5 C) (!) 97.4 F (36.3 C)  TempSrc: Oral Oral Oral Oral  SpO2: 92% 93% 97%   Weight:   62.9 kg   Height:       Filed Weights   04/10/20 0806 04/10/20 1752 04/11/20 0413  Weight: 69.4 kg 64.6 kg 62.9 kg   Body mass index is 20.48 kg/m.   Gen Exam:Alert awake-not in any distress HEENT:atraumatic, normocephalic Chest: B/L clear to auscultation anteriorly CVS:S1S2 regular Abdomen:soft non tender, non distended Extremities:++ edema Neurology: Non focal Skin: no rash  I have personally reviewed following labs and imaging studies  LABORATORY DATA: CBC: Recent Labs  Lab 04/10/20 0756  WBC 12.0*  NEUTROABS 7.1  HGB 12.0*  HCT 41.2  MCV 97.4  PLT 409    Basic Metabolic Panel: Recent Labs  Lab 04/10/20 0756 04/11/20 0319  NA 140 140  K 4.6 3.4*  CL 108 105  CO2 20* 23  GLUCOSE 254* 93  BUN 26* 27*  CREATININE 1.46* 1.46*  CALCIUM 8.9 9.0  MG 2.2 2.0  PHOS  --  3.4    GFR: Estimated Creatinine Clearance: 31.7 mL/min (A) (by C-G formula based on SCr of 1.46 mg/dL (H)).  Liver Function Tests: No results for input(s): AST, ALT, ALKPHOS, BILITOT, PROT, ALBUMIN in the last 168 hours. No results for input(s): LIPASE, AMYLASE in the last 168 hours. No results for input(s): AMMONIA in the last 168  hours.  Coagulation Profile: No results for input(s): INR, PROTIME in the last 168 hours.  Cardiac Enzymes: No results for input(s): CKTOTAL, CKMB, CKMBINDEX, TROPONINI in the last 168 hours.  BNP (last 3 results) No results for input(s): PROBNP in the last 8760 hours.  Lipid Profile: No results for input(s): CHOL, HDL, LDLCALC, TRIG, CHOLHDL, LDLDIRECT in the last 72 hours.  Thyroid Function Tests: No results for input(s): TSH, T4TOTAL, FREET4, T3FREE, THYROIDAB in the last 72 hours.  Anemia Panel: No results for input(s): VITAMINB12, FOLATE, FERRITIN, TIBC, IRON, RETICCTPCT in the last 72 hours.  Urine analysis:    Component Value Date/Time   COLORURINE YELLOW 09/08/2019 0914   APPEARANCEUR CLEAR 09/08/2019 0914   LABSPEC 1.025 09/08/2019 0914   PHURINE 6.0 09/08/2019 0914   GLUCOSEU NEGATIVE 09/08/2019 0914   HGBUR TRACE (A) 09/08/2019 0914   BILIRUBINUR  NEGATIVE 09/08/2019 0914   KETONESUR NEGATIVE 09/08/2019 0914   PROTEINUR NEGATIVE 09/08/2019 0914   NITRITE NEGATIVE 09/08/2019 0914   LEUKOCYTESUR NEGATIVE 09/08/2019 0914    Sepsis Labs: Lactic Acid, Venous    Component Value Date/Time   LATICACIDVEN 1.4 04/10/2020 1812    MICROBIOLOGY: Recent Results (from the past 240 hour(s))  SARS Coronavirus 2 by RT PCR     Status: None   Collection Time: 04/10/20  7:56 AM  Result Value Ref Range Status   SARS Coronavirus 2 NEGATIVE NEGATIVE Final    Comment: (NOTE) Result indicates the ABSENCE of SARS-CoV-2 RNA in the patient specimen.   The lowest concentration of SARS-CoV-2 viral copies this assay can detect in nasopharyngeal swab specimens is 500 copies / mL.  A negative result does not preclude SARS-CoV-2 infection and should not be used as the sole basis for patient management decisions. A negative result may occur with improper specimen collection / handling, submission of a specimen other than nasopharyngeal swab, presence of viral mutation(s) within the  areas targeted by this assay, and inadequate number of viral copies (<500 copies / mL) present.  Negative results must be combined with clinical observations, patient history, and epidemiological information.   The expected result is NEGATIVE.   Patient Fact Sheet:  BlogSelections.co.uk    Provider Fact Sheet:  https://lucas.com/    This test is not yet approved or cleared by the [place] FDA and  has been [place] orized for detection and/or diagnosis of SARS-CoV-2 by FDA under an Emergency Use Authorization (EUA).  This EUA will remain in effect (meaning this test can be used) for the duration of  the COVID-19 declaration under Section 564(b)(1) of the Act, 21 U.S.C. section 360bbb-3(b)(1), unless the authorization is terminated or revoked sooner  Performed at [facility], [place] [address] [place], [place] [address]     RADIOLOGY STUDIES/RESULTS: DG Chest Portable 1 View  Result Date: 04/10/2020 CLINICAL DATA:  Shortness of breath EXAM: PORTABLE CHEST 1 VIEW COMPARISON:  09/08/2019 FINDINGS: Interstitial prominence. Patchy atelectasis at lung bases. No significant pleural effusion. No pneumothorax. Similar cardiomediastinal contours with normal heart size. IMPRESSION: Chronic interstitial prominence with probable superimposed mild edema or atypical pneumonia. Electronically Signed   By: Macy Mis M.D.   On: 04/10/2020 08:21     LOS: 1 day   Oren Binet, MD  [facility]    To contact the attending provider between 7A-7P or the covering provider during after hours 7P-7A, please log into the web site [email or web address] and access using universal [place] password for that web site. If you do not have the password, please call the hospital operator.  04/11/2020, 11:45 AM

## 2020-04-11 NOTE — Plan of Care (Signed)
New admit within 24  hours

## 2020-04-12 LAB — BASIC METABOLIC PANEL
Anion gap: 12 (ref 5–15)
BUN: 32 mg/dL — ABNORMAL HIGH (ref 8–23)
CO2: 24 mmol/L (ref 22–32)
Calcium: 9.4 mg/dL (ref 8.9–10.3)
Chloride: 102 mmol/L (ref 98–111)
Creatinine, Ser: 1.39 mg/dL — ABNORMAL HIGH (ref 0.61–1.24)
GFR calc Af Amer: 52 mL/min — ABNORMAL LOW (ref >60)
GFR calc non Af Amer: 45 mL/min — ABNORMAL LOW (ref >60)
Glucose, Bld: 96 mg/dL (ref 70–99)
Potassium: 3.9 mmol/L (ref 3.5–5.1)
Sodium: 138 mmol/L (ref 135–145)

## 2020-04-12 LAB — GLUCOSE, CAPILLARY: Glucose-Capillary: 154 mg/dL — ABNORMAL HIGH (ref 70–99)

## 2020-04-12 MED ORDER — POTASSIUM CHLORIDE CRYS ER 20 MEQ PO TBCR: 20.0000 meq | EXTENDED_RELEASE_TABLET | Freq: Every day | ORAL | 0 refills | Status: DC

## 2020-04-12 NOTE — Discharge Summary (Signed)
PATIENT DETAILS Name: Dean Bean Age: 86 y.o. Sex: male Date of Birth: 1930-10-11 MRN: [id number]. Admitting Physician: Norval Morton, MD [id number], Caren Griffins, MD  Admit Date: 04/10/2020 Discharge date: 04/12/2020  Recommendations for Outpatient Follow-up:  1. Follow up with PCP in 1-2 weeks 2. Please obtain CMP/CBC in one week  Admitted From:  Home  Disposition: [place]: No  Equipment/Devices: None  Discharge Condition: Stable  CODE STATUS: FULL CODE  Diet recommendation:  Diet Order            Diet - low sodium heart healthy           Diet Heart Room service appropriate? Yes; Fluid consistency: Thin; Fluid restriction: 2000 mL Fluid  Diet effective now                  Brief Narrative: Patient is a 86 y.o. male with past medical history of CAD s/p CABG 2013-s/p PCI in [id number] diastolic heart failure, chronic kidney disease stage IIIb who presented with shortness of breath-found to have acute hypoxic respiratory failure due to decompensated diastolic heart failure.  See below for further details.  Significant events: 7/28>> admit for hypoxia requiring BiPAP due to decompensated heart failure  Significant studies: 7/28>> chest x-ray: Chronic interstitial prominence with probable superimposed mild edema 7/20>> echo: EF 34-19%, grade 2 diastolic dysfunction, mild to moderate MR  Antimicrobial therapy: None  Microbiology data: None  Procedures : None  Consults: None  Brief Hospital Course: Acute hypoxic respiratory failure: Secondary to decompensated diastolic heart failure-required BiPAP on initial presentation-but rapidly improved with IV Lasix-currently on room air since yesterday.   Decompensated diastolic heart failure: Volume status markedly better-only minimal lower extremity edema today-doubt weights are reliable.  Continue diuretic regimen on discharge.  Follow-up with primary care practitioner.  Suspect  exacerbation of heart failure was due to dietary/fluid noncompliance.  Has been counseled extensively.    Minimally elevated troponins: Not consistent with ACS-trend is flat-doubt any significance-suspect no further work-up is required  Hypokalemia: Repleted  CKD stage IIIb: Creatinine not far from baseline-follow periodically.  Normocytic anemia: Hemoglobin stable-no active evidence of GI loss-follow periodically.  HTN: BP stable-continue Coreg and diuretics.  CAD: No anginal symptoms-remains on Plavix, beta-blocker, Imdur and statin.  PAF s/p ablation: Continue Coreg-Per outpatient cardiology notes-not on long-term anticoagulation since he has no recurrence of AF following ablation.  HLD: Continue statin  PAD: Stable for outpatient follow-up with his outpatient physicians.  Alcohol use: Drinks 1-2 glasses of wine per night with dinner-no signs of withdrawal.    Per nursing staff-no major issues overnight-ambulating in the room by himself.  Discharge Diagnoses:  Principal Problem:   Acute on chronic systolic CHF (congestive heart failure) (HCC) Active Problems:   HTN (hypertension)   CAD (coronary artery disease) CABG 2013 and PCI stent 2015   Mitral regurgitation   Acute respiratory failure with hypoxia (HCC)   Alcohol use   CKD (chronic kidney disease), stage III   Normocytic anemia   SIRS (systemic inflammatory response syndrome) ([place])   Discharge Instructions:  Activity:  As tolerated  Discharge Instructions    (HEART FAILURE PATIENTS) Call MD:  Anytime you have any of the following symptoms: 1) 3 pound weight gain in 24 hours or 5 pounds in 1 week 2) shortness of breath, with or without a dry hacking cough 3) swelling in the hands, feet or stomach 4) if you have to sleep on extra pillows at night in order  to breathe.   Complete by: As directed    Call MD for:  difficulty breathing, headache or visual disturbances   Complete by: As directed    Diet - low  sodium heart healthy   Complete by: As directed    Discharge instructions   Complete by: As directed    Follow with Primary MD  Harlan Stains, MD in 1-2 weeks  Please get a complete blood count and chemistry panel checked by your Primary MD at your next visit, and again as instructed by your Primary MD.  Get Medicines reviewed and adjusted: Please take all your medications with you for your next visit with your Primary MD  Laboratory/radiological data: Please request your Primary MD to go over all hospital tests and procedure/radiological results at the follow up, please ask your Primary MD to get all Hospital records sent to his/her office.  In some cases, they will be blood work, cultures and biopsy results pending at the time of your discharge. Please request that your primary care M.D. follows up on these results.  Also Note the following: If you experience worsening of your admission symptoms, develop shortness of breath, life threatening emergency, suicidal or homicidal thoughts you must seek medical attention immediately by calling 911 or calling your MD immediately  if symptoms less severe.  You must read complete instructions/literature along with all the possible adverse reactions/side effects for all the Medicines you take and that have been prescribed to you. Take any new Medicines after you have completely understood and accpet all the possible adverse reactions/side effects.   Do not drive when taking Pain medications or sleeping medications (Benzodaizepines)  Do not take more than prescribed Pain, Sleep and Anxiety Medications. It is not advisable to combine anxiety,sleep and pain medications without talking with your primary care practitioner  Special Instructions: If you have smoked or chewed Tobacco  in the last 2 yrs please stop smoking, stop any regular Alcohol  and or any Recreational drug use.  Wear Seat belts while driving.  Please note: You were cared for by a  hospitalist during your hospital stay. Once you are discharged, your primary care physician will handle any further medical issues. Please note that NO REFILLS for any discharge medications will be authorized once you are discharged, as it is imperative that you return to your primary care physician (or establish a relationship with a primary care physician if you do not have one) for your post hospital discharge needs so that they can reassess your need for medications and monitor your lab values.   Increase activity slowly   Complete by: As directed      Allergies as of 04/12/2020   No Known Allergies     Medication List    TAKE these medications   carvedilol 6.25 MG tablet Commonly known as: COREG Take 6.25 mg by mouth 2 (two) times daily.   clopidogrel 75 MG tablet Commonly known as: PLAVIX Take 75 mg by mouth daily.   CORICIDIN HBP CONGESTION/COUGH PO Take 1 tablet by mouth as needed.   furosemide 40 MG tablet Commonly known as: LASIX Take 40 mg by mouth daily.   Poly-Iron 150 150 MG capsule Generic drug: iron polysaccharides Take 150 mg by mouth at bedtime.   polycarbophil 625 MG tablet Commonly known as: FIBERCON Take 625 mg by mouth daily.   potassium chloride SA 20 MEQ tablet Commonly known as: KLOR-CON Take 1 tablet (20 mEq total) by mouth daily.   simvastatin 20  MG tablet Commonly known as: ZOCOR Take 20 mg by mouth daily.   vitamin C 1000 MG tablet Take 1,000 mg by mouth daily.       Follow-up Information    Harlan Stains, MD. Schedule an appointment as soon as possible for a visit in 1 week(s).   Specialty: Family Medicine Contact information: [address] [place] [address] [phone]        Burnell Blanks, MD. Schedule an appointment as soon as possible for a visit in 2 week(s).   Specialty: Cardiology Contact information: [address] [place] [address] [phone]              No  Known Allergies   Other Procedures/Studies: DG Chest Portable 1 View  Result Date: 04/10/2020 CLINICAL DATA:  Shortness of breath EXAM: PORTABLE CHEST 1 VIEW COMPARISON:  09/08/2019 FINDINGS: Interstitial prominence. Patchy atelectasis at lung bases. No significant pleural effusion. No pneumothorax. Similar cardiomediastinal contours with normal heart size. IMPRESSION: Chronic interstitial prominence with probable superimposed mild edema or atypical pneumonia. Electronically Signed   By: Macy Mis M.D.   On: 04/10/2020 08:21   ECHOCARDIOGRAM COMPLETE  Result Date: 04/02/2020    ECHOCARDIOGRAM REPORT   Patient Name:   SANFORD LINDBLAD Date of Exam: 04/02/2020 Medical Rec #:  [id number]         Height:       69.0 in Accession #:    [id number]        Weight:       153.0 lb Date of Birth:  01-06-1931         BSA:          1.844 m Patient Age:    16 years          BP:           110/60 mmHg Patient Gender: M                 HR:           99 bpm. Exam Location:  [address] Procedure: 3D Echo, 2D Echo, Cardiac Doppler and Color Doppler Indications:    I05.9 Mitral Valve Disorder  History:        Patient has prior history of Echocardiogram examinations, most                 recent 09/09/2019. CHF, CAD, Prior CABG, COPD, Arrythmias:Atrial                 Fibrillation, Signs/Symptoms:Shortness of Breath; Risk                 Factors:Family History of Coronary Artery Disease, Former Smoker                 and Hypertension.  Sonographer:    Deliah Boston RDCS Referring Phys: [place]  1. Left ventricular ejection fraction, by estimation, is 50 to 55%. The left ventricle has low normal function. The left ventricle demonstrates regional wall motion abnormalities. Basal inferior/inferolateral hypokinesis. There is mild left ventricular hypertrophy. Left ventricular diastolic parameters are consistent with Grade II diastolic dysfunction (pseudonormalization). Elevated left atrial  pressure.  2. Right ventricular systolic function is normal. The right ventricular size is normal. Tricuspid regurgitation signal is inadequate for assessing PA pressure.  3. Left atrial size was severely dilated.  4. Right atrial size was moderately dilated.  5. The mitral valve is normal in structure. Mild to  moderate mitral valve regurgitation. ERO 0.1 cm^2, RV 25cc  6. The aortic valve was not well visualized. Aortic valve regurgitation is not visualized. No aortic stenosis is present.  7. The inferior vena cava is normal in size with greater than 50% respiratory variability, suggesting right atrial pressure of 3 mmHg. FINDINGS  Left Ventricle: Left ventricular ejection fraction, by estimation, is 50 to 55%. The left ventricle has low normal function. The left ventricle demonstrates regional wall motion abnormalities. The left ventricular internal cavity size was normal in size. There is mild left ventricular hypertrophy. Left ventricular diastolic parameters are consistent with Grade II diastolic dysfunction (pseudonormalization). Elevated left atrial pressure. Right Ventricle: The right ventricular size is normal. Right vetricular wall thickness was not assessed. Right ventricular systolic function is normal. Tricuspid regurgitation signal is inadequate for assessing PA pressure. Left Atrium: Left atrial size was severely dilated. Right Atrium: Right atrial size was moderately dilated. Pericardium: There is no evidence of pericardial effusion. Mitral Valve: The mitral valve is normal in structure. Mild to moderate mitral valve regurgitation. Tricuspid Valve: The tricuspid valve is normal in structure. Tricuspid valve regurgitation is trivial. Aortic Valve: The aortic valve was not well visualized. Aortic valve regurgitation is not visualized. No aortic stenosis is present. Pulmonic Valve: The pulmonic valve was not well visualized. Pulmonic valve regurgitation is not visualized. Aorta: The aortic root and  ascending aorta are structurally normal, with no evidence of dilitation. Venous: The inferior vena cava is normal in size with greater than 50% respiratory variability, suggesting right atrial pressure of 3 mmHg. IAS/Shunts: No atrial level shunt detected by color flow Doppler.  LEFT VENTRICLE PLAX 2D LVIDd:         4.90 cm  Diastology LVIDs:         3.30 cm  LV e' lateral:   8.81 cm/s LV PW:         0.90 cm  LV E/e' lateral: 13.6 LV IVS:        1.10 cm  LV e' medial:    4.90 cm/s LVOT diam:     2.25 cm  LV E/e' medial:  24.5 LV SV:         71 LV SV Index:   38 LVOT Area:     3.98 cm                          3D Volume EF:                         3D EF:        61 %                         LV EDV:       172 ml                         LV ESV:       67 ml                         LV SV:        105 ml RIGHT VENTRICLE RV S prime:     11.70 cm/s TAPSE (M-mode): 1.9 cm LEFT ATRIUM             Index       RIGHT ATRIUM  Index LA diam:        4.60 cm 2.50 cm/m  RA Area:     22.60 cm LA Vol (A2C):   75.2 ml 40.79 ml/m RA Volume:   74.20 ml  40.25 ml/m LA Vol (A4C):   94.9 ml 51.47 ml/m LA Biplane Vol: 90.7 ml 49.20 ml/m  AORTIC VALVE LVOT Vmax:   74.30 cm/s LVOT Vmean:  45.500 cm/s LVOT VTI:    0.178 m  AORTA Ao Root diam: 3.40 cm Ao Asc diam:  3.00 cm MITRAL VALVE MV Area (PHT): cm          SHUNTS MV Decel Time: 198 msec     Systemic VTI:  0.18 m MR Peak grad:   159.5 mmHg  Systemic Diam: 2.25 cm MR Mean grad:   100.5 mmHg MR Vmax:        631.50 cm/s MR Vmean:       472.5 cm/s MR PISA:        2.26 cm MR PISA Radius: 0.60 cm MV E velocity: 120.00 cm/s MV A velocity: 70.40 cm/s MV E/A ratio:  1.70 Oswaldo Milian MD Electronically signed by Oswaldo Milian MD Signature Date/Time: 04/02/2020/6:04:51 PM    Final      TODAY-DAY OF DISCHARGE:  Subjective:   Georgi Heninger today has no headache,no chest abdominal pain,no new weakness tingling or numbness, feels much better wants to go home today.     Objective:   Blood pressure (!) 137/63, pulse 81, temperature 97.9 F (36.6 C), temperature source Oral, resp. rate 14, height 5\' 9"  (1.753 m), weight 64.9 kg, SpO2 95 %.  Intake/Output Summary (Last 24 hours) at 04/12/2020 0952 Last data filed at 04/12/2020 0815 Gross per 24 hour  Intake 740 ml  Output 1750 ml  Net -1010 ml   Filed Weights   04/10/20 1752 04/11/20 0413 04/12/20 0408  Weight: 64.6 kg 62.9 kg 64.9 kg    Exam: Awake Alert, Oriented *3, No new F.N deficits, Normal affect [place].AT,PERRAL Supple Neck,No JVD, No cervical lymphadenopathy appriciated.  Symmetrical Chest wall movement, Good air movement bilaterally, CTAB RRR,No Gallops,Rubs or new Murmurs, No Parasternal Heave +ve B.Sounds, Abd Soft, Non tender, No organomegaly appriciated, No rebound -guarding or rigidity. No Cyanosis, Clubbing or edema, No new Rash or bruise   PERTINENT RADIOLOGIC STUDIES: No results found.   PERTINENT LAB RESULTS: CBC: Recent Labs    04/10/20 0756  WBC 12.0*  HGB 12.0*  HCT 41.2  PLT 346   CMET CMP     Component Value Date/Time   NA 138 04/12/2020 0522   NA 138 12/29/2016 1200   K 3.9 04/12/2020 0522   CL 102 04/12/2020 0522   CO2 24 04/12/2020 0522   GLUCOSE 96 04/12/2020 0522   BUN 32 (H) 04/12/2020 0522   BUN 44 (H) 12/29/2016 1200   CREATININE 1.39 (H) 04/12/2020 0522   CALCIUM 9.4 04/12/2020 0522   PROT 7.6 09/08/2019 0813   ALBUMIN 4.3 09/08/2019 0813   AST 19 09/08/2019 0813   ALT 18 09/08/2019 0813   ALKPHOS 64 09/08/2019 0813   BILITOT 0.9 09/08/2019 0813   GFRNONAA 45 (L) 04/12/2020 0522   GFRAA 52 (L) 04/12/2020 0522    GFR Estimated Creatinine Clearance: 34.4 mL/min (A) (by C-G formula based on SCr of 1.39 mg/dL (H)). No results for input(s): LIPASE, AMYLASE in the last 72 hours. No results for input(s): CKTOTAL, CKMB, CKMBINDEX, TROPONINI in the last 72 hours. Invalid input(s): POCBNP No results for  input(s): DDIMER in the last 72  hours. Recent Labs    04/11/20 0319  HGBA1C 5.1   No results for input(s): CHOL, HDL, LDLCALC, TRIG, CHOLHDL, LDLDIRECT in the last 72 hours. No results for input(s): TSH, T4TOTAL, T3FREE, THYROIDAB in the last 72 hours.  Invalid input(s): FREET3 No results for input(s): VITAMINB12, FOLATE, FERRITIN, TIBC, IRON, RETICCTPCT in the last 72 hours. Coags: No results for input(s): INR in the last 72 hours.  Invalid input(s): PT Microbiology: Recent Results (from the past 240 hour(s))  SARS Coronavirus 2 by RT PCR     Status: None   Collection Time: 04/10/20  7:56 AM  Result Value Ref Range Status   SARS Coronavirus 2 NEGATIVE NEGATIVE Final    Comment: (NOTE) Result indicates the ABSENCE of SARS-CoV-2 RNA in the patient specimen.   The lowest concentration of SARS-CoV-2 viral copies this assay can detect in nasopharyngeal swab specimens is 500 copies / mL.  A negative result does not preclude SARS-CoV-2 infection and should not be used as the sole basis for patient management decisions. A negative result may occur with improper specimen collection / handling, submission of a specimen other than nasopharyngeal swab, presence of viral mutation(s) within the areas targeted by this assay, and inadequate number of viral copies (<500 copies / mL) present.  Negative results must be combined with clinical observations, patient history, and epidemiological information.   The expected result is NEGATIVE.   Patient Fact Sheet:  BlogSelections.co.uk    Provider Fact Sheet:  https://lucas.com/    This test is not yet approved or cleared by the [place] FDA and  has been [place] orized for detection and/or diagnosis of SARS-CoV-2 by FDA under an Emergency Use Authorization (EUA).  This EUA will remain in effect (meaning this test can be used) for the duration of  the COVID-19 declaration under Section 564(b)(1) of the Act, 21 U.S.C. section  360bbb-3(b)(1), unless the authorization is terminated or revoked sooner  Performed at [facility], [place] [address], [place], [place] [address]     FURTHER DISCHARGE INSTRUCTIONS:  Get Medicines reviewed and adjusted: Please take all your medications with you for your next visit with your Primary MD  Laboratory/radiological data: Please request your Primary MD to go over all hospital tests and procedure/radiological results at the follow up, please ask your Primary MD to get all Hospital records sent to his/her office.  In some cases, they will be blood work, cultures and biopsy results pending at the time of your discharge. Please request that your primary care M.D. goes through all the records of your hospital data and follows up on these results.  Also Note the following: If you experience worsening of your admission symptoms, develop shortness of breath, life threatening emergency, suicidal or homicidal thoughts you must seek medical attention immediately by calling 911 or calling your MD immediately  if symptoms less severe.  You must read complete instructions/literature along with all the possible adverse reactions/side effects for all the Medicines you take and that have been prescribed to you. Take any new Medicines after you have completely understood and accpet all the possible adverse reactions/side effects.   Do not drive when taking Pain medications or sleeping medications (Benzodaizepines)  Do not take more than prescribed Pain, Sleep and Anxiety Medications. It is not advisable to combine anxiety,sleep and pain medications without talking with your primary care practitioner  Special Instructions: If you have smoked or chewed Tobacco  in the last  2 yrs please stop smoking, stop any regular Alcohol  and or any Recreational drug use.  Wear Seat belts while driving.  Please note: You were cared for by a hospitalist during your hospital stay. Once you are discharged,  your primary care physician will handle any further medical issues. Please note that NO REFILLS for any discharge medications will be authorized once you are discharged, as it is imperative that you return to your primary care physician (or establish a relationship with a primary care physician if you do not have one) for your post hospital discharge needs so that they can reassess your need for medications and monitor your lab values.  Total Time spent coordinating discharge including counseling, education and face to face time equals 35 minutes.  SignedOren Binet 04/12/2020 9:52 AM

## 2020-04-12 NOTE — Progress Notes (Signed)
PT Cancellation Note  Patient Details Name: Dean Bean MRN: [id number] DOB: 11-03-1930   Cancelled Treatment:    Reason Eval/Treat Not Completed: PT screened, no needs identified, will sign off - pt reports no acute or post-acute PT needs, reports he has been mobilizing in hospital room, and is eager to d/c.   Marisa Cyphers, PT Acute Rehabilitation Services Pager [phone]  Office [phone]    [place] 04/12/2020, 1:16 PM

## 2020-04-12 NOTE — Plan of Care (Signed)

## 2020-04-16 DIAGNOSIS — D509 Iron deficiency anemia, unspecified: Secondary | ICD-10-CM | POA: Diagnosis not present

## 2020-04-16 DIAGNOSIS — I129 Hypertensive chronic kidney disease with stage 1 through stage 4 chronic kidney disease, or unspecified chronic kidney disease: Secondary | ICD-10-CM | POA: Diagnosis not present

## 2020-04-16 DIAGNOSIS — N1831 Chronic kidney disease, stage 3a: Secondary | ICD-10-CM | POA: Diagnosis not present

## 2020-04-16 DIAGNOSIS — I5023 Acute on chronic systolic (congestive) heart failure: Secondary | ICD-10-CM | POA: Diagnosis not present

## 2020-04-16 DIAGNOSIS — R198 Other specified symptoms and signs involving the digestive system and abdomen: Secondary | ICD-10-CM | POA: Diagnosis not present

## 2020-04-16 DIAGNOSIS — R21 Rash and other nonspecific skin eruption: Secondary | ICD-10-CM | POA: Diagnosis not present

## 2020-04-18 ENCOUNTER — Ambulatory Visit (INDEPENDENT_AMBULATORY_CARE_PROVIDER_SITE_OTHER): Payer: Medicare Other | Admitting: Pharmacist

## 2020-04-18 ENCOUNTER — Other Ambulatory Visit: Payer: Self-pay

## 2020-04-18 VITALS — BP 154/64 | HR 71

## 2020-04-18 DIAGNOSIS — I255 Ischemic cardiomyopathy: Secondary | ICD-10-CM | POA: Diagnosis not present

## 2020-04-18 DIAGNOSIS — I5022 Chronic systolic (congestive) heart failure: Secondary | ICD-10-CM | POA: Diagnosis not present

## 2020-04-18 NOTE — Patient Instructions (Addendum)
Continue taking carvedilol 6.25mg  BID and furosemide 40mg  daily  I will talk with Dr. Angelena Form about possibly restarting lisinopril or spironolactone.  Call us with any weight gain 3 or more lb over night or 5lb in a week  [phone]

## 2020-04-18 NOTE — Progress Notes (Signed)
Patient ID: Dean Bean                 DOB: 14-May-1931                      MRN: [id number]     HPI: Dean Bean is a 86 y.o. male referred by Dr. Angelena Form to HTN clinic. PMH is significant for CKD, CAD s/p 4V CABG in 2013, atrial flutter/fib s/p ablation 2013, ischemic cardiomyopathy, PAD chronic CHF and COPD. Patient was hospitalized in [place] in June for CHF. EF in April 2018 and dec 2020 was 45-50%. Most recent EF is  50-55% with grade II diastolic dysfunction. Losartan was started in hospital at the end of May. Itching started in hospital, but second week of June started to get a bumpy rash. Was on lisinopril before but was stopped due to low BP. He called the office on 7/20 stating he had a rash, itching since starting losartan. He was advised to stop losartan and follow up in PharmD clinic.  At last visit with PharmD patient's blood pressure was high, however he attrubited it to being at the docotors. I asked him to check his blood pressure at home and bring his log and meter with him to the next appointment. He was asked to continue to hold losartan. I asked him to buy compression stockings to help with his swelling. His PCP increased his furosemide back to 40mg  because he had swelling with 20mg . However, he was admitted to hospital for SOB on 7/28 and told admitted MD that he was alternating 20mg  and 40mg  of furosemide. He was discharged on 40 mg daily. BMP on 7/30 was stable. Repeat on 8/3 was able stable.  Patient presents today to clinic for follow up. He denies dizziness, lightheadedness, swelling or shortness of breath. He is wearing compression stockings. His dry weight appears to be about 137lb (weight at d/c). Weight at home has been about 139-140. He brings his home BP cuff with him to clinic. Compared to clinic cuff 154/64 vs 158/79 (home). Home blood pressures have been in the 235-361'W systolic with a few outliers.  Keeps track of his sodium intake. His rash and itching have  really calmed down after stopping losartan.   Current HTN meds: carvedilol 6.25mg  BID, furosemide 40mg  daily Previously tried: lisinopril (BP was too low on 5mg  so they stopped it), losartan (itching/rash) BP goal: <130/80  Family History: The patient's family history includes Cancer in his mother; Heart attack (age of onset: 63) in his father.  Social History:  Former smoker (50 pack years, stopped smoking in 1998)  Diet: daughter does not cook with salt  Exercise: not much-   Home BP readings: 115/60 HR 66, 135/75 HR 65, 115/58 HR 75, 116/59 HR 72, 107/59 HR 74, 112/57 HR 70, 127/62 HR 88, 144/67 HR 88, 107/53 HR 64  Wt Readings from Last 3 Encounters:  04/12/20 143 lb 1.3 oz (64.9 kg)  02/19/20 153 lb (69.4 kg)  09/25/19 151 lb (68.5 kg)   BP Readings from Last 3 Encounters:  04/12/20 (!) 137/63  04/05/20 (!) 150/62  02/19/20 110/60   Pulse Readings from Last 3 Encounters:  04/12/20 81  04/05/20 77  02/19/20 84    Renal function: Estimated Creatinine Clearance: 34.4 mL/min (A) (by C-G formula based on SCr of 1.39 mg/dL (H)).  Past Medical History:  Diagnosis Date  . Atrial fibrillation ([place])   . CAD (coronary artery disease)  s/p CABG  . COPD (chronic obstructive pulmonary disease) ([place])   . HTN (hypertension)   . Ischemic cardiomyopathy   . Mitral regurgitation   . Systolic CHF [facility])     Current Outpatient Medications on File Prior to Visit  Medication Sig Dispense Refill  . Ascorbic Acid (VITAMIN C) 1000 MG tablet Take 1,000 mg by mouth daily.    . carvedilol (COREG) 6.25 MG tablet Take 6.25 mg by mouth 2 (two) times daily.    . clopidogrel (PLAVIX) 75 MG tablet Take 75 mg by mouth daily.    Marland Kitchen Dextromethorphan-guaiFENesin (CORICIDIN HBP CONGESTION/COUGH PO) Take 1 tablet by mouth as needed. (Patient not taking: Reported on 04/10/2020)    . furosemide (LASIX) 40 MG tablet Take 40 mg by mouth daily.     Marland Kitchen POLY-IRON 150 150 MG capsule Take 150 mg by mouth at  bedtime.    . polycarbophil (FIBERCON) 625 MG tablet Take 625 mg by mouth daily.    . potassium chloride SA (KLOR-CON) 20 MEQ tablet Take 1 tablet (20 mEq total) by mouth daily. 30 tablet 0  . simvastatin (ZOCOR) 20 MG tablet Take 20 mg by mouth daily.     No current facility-administered medications on file prior to visit.    No Known Allergies  There were no vitals taken for this visit.   Assessment/Plan:  1. Hypertension - Blood pressure is high today in clinic, above goal of <130/80. However, his readings at home have been mostly within goal. Home cuff found to be accurate (for systolic, diastolic runs a little high). His rash/itching has improved. Will not retry with losartan. We could retry him on lisinopril at an even lower dose of 2.5mg  (lowest his Dr. Had dropped dose before stopping was 5mg ), however there really isn't any evidence for its use in HFpEF. Spironolactone does have some evidence for benefit in both midrange (his previous EF) and preserved. The question also is, if we consider his previous EF as reduced and now he is recovered, is it safe to stop one of the guideline directed medical therapies (ie ACEI)? Advised patient I would discuss with Dr. Angelena Form and get back to him.   Thank you  Ramond Dial, Pharm.D, BCPS, CPP [facility]  [address], [place], [place] [address]  Phone: [phone]; Fax: [phone]

## 2020-04-22 ENCOUNTER — Telehealth: Payer: Self-pay | Admitting: Pharmacist

## 2020-04-22 NOTE — Telephone Encounter (Signed)
Spoke with patient about Dr. Angelena Form recommendations  If his BP is controlled now on no ARB/Ace-inh or spiro, I would be ok not restarting any in regards to his LVEF since he is now fully recovered. If he needs additional BP control, I think low dose Ace-inh or Arlyce Harman would be appropriate  Although patient BP has been high in the clinic, his home readings are pretty consistently controlled. Will leave medications the same. Continue carvedilol 6.25mg  BID and furosemide 40mg  daily.  Weight up just 1 lb from visit. Advised he call us if he gains >3lb overnight or 5lb in a week.

## 2020-04-29 ENCOUNTER — Ambulatory Visit: Payer: Medicare Other | Admitting: Cardiovascular Disease

## 2020-05-01 NOTE — Progress Notes (Signed)
Cardiology Office Note    Date:  05/07/2020   ID:  Dean Bean, DOB Aug 08, 1931, MRN [id number]  PCP:  Harlan Stains, MD  Cardiologist: Lauree Chandler, MD EPS: None  Chief Complaint  Patient presents with  . Hospitalization Follow-up    History of Present Illness:  Dean Bean is a 86 y.o. male with history of CKD, CAD s/p 4V CABG in 2013, DES to the left main into the diagonal 2016, atrial flutter/fib s/p ablation 2013, ischemic cardiomyopathy, chronic systolic CHF and COPD.    He was hospitalized with acute CHF in [place] 01/2020 in the setting of hypertensive crisis and anemia.  He was diuresed.  Echo in [place] showed moderate to severe MR and mild AS EF 50 to 55% grade 2 DD.  He was told he may need a mitral clip.    Patient saw Dr. Angelena Form 02/19/2020 plan was to repeat echo in 6 weeks to confirm MR is not severe. Echo 04/02/20 mild to mod MR.Patient then saw pharmacy 04/05/2020 because of a rash felt possibly due to losartan.   Patient discharged from the hospital 04/12/2020 with a diagnosis of acute hypoxic respiratory failure due to decompensated diastolic heart failure felt secondary to dietary/fluid noncompliance.  2D echo 04/02/2020 LVEF 50 to 55% with grade 2 DD and mild to moderate MR.  Blood pressure was low in the ER.  Patient comes in for f/u. Denies chest pain, shortness of breath, dizziness or presyncope. Has ankle swelling if he doesn't wear compression stockings. BP up today but usually 105-130 sys at home.Crt 1.36 at discharge 1.46 04/16/20. Being checked tomorrow. Off lisinopril.  Past Medical History:  Diagnosis Date  . Atrial fibrillation ([place])   . CAD (coronary artery disease)    s/p CABG  . COPD (chronic obstructive pulmonary disease) ([place])   . HTN (hypertension)   . Ischemic cardiomyopathy   . Mitral regurgitation   . Systolic CHF [facility])     Past Surgical History:  Procedure Laterality Date  . CORONARY ARTERY BYPASS GRAFT      4Vessel    Current Medications: Current Meds  Medication Sig  . Ascorbic Acid (VITAMIN C) 1000 MG tablet Take 1,000 mg by mouth daily.  . carvedilol (COREG) 6.25 MG tablet Take 6.25 mg by mouth 2 (two) times daily.  . clopidogrel (PLAVIX) 75 MG tablet Take 75 mg by mouth daily.  Marland Kitchen Dextromethorphan-guaiFENesin (CORICIDIN HBP CONGESTION/COUGH PO) Take 1 tablet by mouth as needed.   . furosemide (LASIX) 40 MG tablet Take 40 mg by mouth daily.   Marland Kitchen POLY-IRON 150 150 MG capsule Take 150 mg by mouth at bedtime.  . polycarbophil (FIBERCON) 625 MG tablet Take 625 mg by mouth daily.  . potassium chloride SA (KLOR-CON) 20 MEQ tablet Take 1 tablet (20 mEq total) by mouth daily.  . simvastatin (ZOCOR) 20 MG tablet Take 20 mg by mouth daily.  . tamsulosin (FLOMAX) 0.4 MG CAPS capsule      Allergies:   Losartan potassium   Social History   Socioeconomic History  . Marital status: Married    Spouse name: Not on file  . Number of children: 3  . Years of education: Not on file  . Highest education level: Not on file  Occupational History  . Occupation: Control and instrumentation engineer for [employer]  Tobacco Use  . Smoking status: Former Smoker    Packs/day: 1.00    Years: 50.00    Pack years: 50.00    Types: Cigarettes  Quit date: 10/02/1996    Years since quitting: 23.6  . Smokeless tobacco: Never Used  Vaping Use  . Vaping Use: Never used  Substance and Sexual Activity  . Alcohol use: Yes    Alcohol/week: 7.0 standard drinks    Types: 7 Glasses of wine per week  . Drug use: No  . Sexual activity: Not on file  Other Topics Concern  . Not on file  Social History Narrative  . Not on file   Social Determinants of Health   Financial Resource Strain:   . Difficulty of Paying Living Expenses: Not on file  Food Insecurity:   . Worried About Charity fundraiser in the Last Year: Not on file  . Ran Out of Food in the Last Year: Not on file  Transportation Needs:   . Lack of Transportation  (Medical): Not on file  . Lack of Transportation (Non-Medical): Not on file  Physical Activity:   . Days of Exercise per Week: Not on file  . Minutes of Exercise per Session: Not on file  Stress:   . Feeling of Stress : Not on file  Social Connections:   . Frequency of Communication with Friends and Family: Not on file  . Frequency of Social Gatherings with Friends and Family: Not on file  . Attends Religious Services: Not on file  . Active Member of Clubs or Organizations: Not on file  . Attends Archivist Meetings: Not on file  . Marital Status: Not on file     Family History:  The patient's family history includes Cancer in his mother; Heart attack (age of onset: 50) in his father.   ROS:   Please see the history of present illness.    ROS All other systems reviewed and are negative.   PHYSICAL EXAM:   VS:  BP (!) 150/80   Pulse 74   Ht 5\' 9"  (1.753 m)   Wt 145 lb (65.8 kg)   SpO2 98%   BMI 21.41 kg/m   Physical Exam  GEN: Thin, in no acute distress  Neck: no JVD, carotid bruits, or masses Cardiac:RRR; 2/6 systolic murmur at the apex Respiratory:  clear to auscultation bilaterally, normal work of breathing GI: soft, nontender, nondistended, + BS Ext: without cyanosis, clubbing, or edema, Good distal pulses bilaterally Neuro:  Alert and Oriented x 3 Psych: euthymic mood, full affect  Wt Readings from Last 3 Encounters:  05/07/20 145 lb (65.8 kg)  04/12/20 143 lb 1.3 oz (64.9 kg)  02/19/20 153 lb (69.4 kg)      Studies/Labs Reviewed:   EKG:  EKG is not ordered today.    Recent Labs: 09/08/2019: ALT 18 04/10/2020: B Natriuretic Peptide 781.4; Hemoglobin 12.0; Platelets 346 04/11/2020: Magnesium 2.0 04/12/2020: BUN 32; Creatinine, Ser 1.39; Potassium 3.9; Sodium 138   Lipid Panel No results found for: CHOL, TRIG, HDL, CHOLHDL, VLDL, LDLCALC, LDLDIRECT  Additional studies/ records that were reviewed today include:  2D echo 04/02/2020   IMPRESSIONS       1. Left ventricular ejection fraction, by estimation, is 50 to 55%. The  left ventricle has low normal function. The left ventricle demonstrates  regional wall motion abnormalities. Basal inferior/inferolateral  hypokinesis. There is mild left ventricular  hypertrophy. Left ventricular diastolic parameters are consistent with  Grade II diastolic dysfunction (pseudonormalization). Elevated left atrial  pressure.   2. Right ventricular systolic function is normal. The right ventricular  size is normal. Tricuspid regurgitation signal is inadequate for assessing  PA pressure.   3. Left atrial size was severely dilated.   4. Right atrial size was moderately dilated.   5. The mitral valve is normal in structure. Mild to moderate mitral valve  regurgitation. ERO 0.1 cm^2, RV 25cc   6. The aortic valve was not well visualized. Aortic valve regurgitation  is not visualized. No aortic stenosis is present.   7. The inferior vena cava is normal in size with greater than 50%  respiratory variability, suggesting right atrial pressure of 3 mmHg.   FINDINGS   Left Ventricle: Left ventricular ejection fraction, by estimation, is 50  to 55%. The left ventricle has low normal function. The left ventricle  demonstrates regional wall motion abnormalities. The left ventricular  internal cavity size was normal in  size. There is mild left ventricular hypertrophy. Left ventricular  diastolic parameters are consistent with Grade II diastolic dysfunction  (pseudonormalization). Elevated left atrial pressure.   Right Ventricle: The right ventricular size is normal. Right vetricular  wall thickness was not assessed. Right ventricular systolic function is  normal. Tricuspid regurgitation signal is inadequate for assessing PA  pressure.   Left Atrium: Left atrial size was severely dilated.   Right Atrium: Right atrial size was moderately dilated.   Pericardium: There is no evidence of pericardial  effusion.   Mitral Valve: The mitral valve is normal in structure. Mild to moderate  mitral valve regurgitation.   Tricuspid Valve: The tricuspid valve is normal in structure. Tricuspid  valve regurgitation is trivial.   Aortic Valve: The aortic valve was not well visualized. Aortic valve  regurgitation is not visualized. No aortic stenosis is present.   Pulmonic Valve: The pulmonic valve was not well visualized. Pulmonic valve  regurgitation is not visualized.   Aorta: The aortic root and ascending aorta are structurally normal, with  no evidence of dilitation.   Venous: The inferior vena cava is normal in size with greater than 50%  respiratory variability, suggesting right atrial pressure of 3 mmHg.   IAS/Shunts: No atrial level shunt detected by color flow Doppler.    ASSESSMENT:    1. Coronary artery disease involving coronary bypass graft of native heart without angina pectoris   2. Ischemic cardiomyopathy   3. Chronic diastolic CHF (congestive heart failure) ([place])   4. PAD (peripheral artery disease) ([facility])   5. Paroxysmal atrial fibrillation ([place])   6. Non-rheumatic mitral regurgitation   7. Essential hypertension      PLAN:  In order of problems listed above:  1. CAD s/p CABG without angina: He had CABG in 2013 and then had stenting of his left main into the diagonal branch in 2016. This branch was unprotected by the grafts. No chest pain. Will continue Plavix, statin and beta blocker.           2. Ischemic cardiomyopathy: Mild LV systolic dysfunction by echo in December 2020 with LVEF=45%.   Improved to 50 to 55% with grade 2 DD and mild to moderate MR on echo 04/02/2020.    3. Chronic diastolic CHF:   Recent and admission with acute CHF felt secondary to dietary indiscretion and fluid intake.  Patient diuresed and currently compensated. Renal checked by PCP and another appt tomorrow.   4. PAD: He has known PAD by non-invasive studies at [facility] in 2014 with  ABI 0.76 on the left and 0.46 on the right.  Patient has declined repeat ABIs.    5. Atrial fibrillation/flutter: He is s/p ablation. He has  not been on long term anticoagulation since he has has no recurrence of his AF following ablation. He has no palpitations. Will continue beta blocker.     6. Mitral regurgitation: Mild by echo December 2020. Report of moderate to severe MR in [place].  Repeat echo 04/02/2020 mild to moderate MR. Continue to monitor yearly   7. HTN: BP is high today but controlled at home. Off lisinopril because of renal function. Can increase carvedilol if necessary.      Medication Adjustments/Labs and Tests Ordered: Current medicines are reviewed at length with the patient today.  Concerns regarding medicines are outlined above.  Medication changes, Labs and Tests ordered today are listed in the Patient Instructions below. Patient Instructions  Medication Instructions:  Your physician recommends that you continue on your current medications as directed. Please refer to the Current Medication list given to you today.  *If you need a refill on your cardiac medications before your next appointment, please call your pharmacy*   Lab Work: None Today  If you have labs (blood work) drawn today and your tests are completely normal, you will receive your results only by: Marland Kitchen MyChart Message (if you have MyChart) OR . A paper copy in the mail If you have any lab test that is abnormal or we need to change your treatment, we will call you to review the results.   Testing/Procedures: None Today   Follow-Up: At [facility], you and your health needs are our priority.  As part of our continuing mission to provide you with exceptional heart care, we have created designated Provider Care Teams.  These Care Teams include your primary Cardiologist (physician) and Advanced Practice Providers (APPs -  Physician Assistants and Nurse Practitioners) who all work together to provide you  with the care you need, when you need it.  We recommend signing up for the patient portal called "MyChart".  Sign up information is provided on this After Visit Summary.  MyChart is used to connect with patients for Virtual Visits (Telemedicine).  Patients are able to view lab/test results, encounter notes, upcoming appointments, etc.  Non-urgent messages can be sent to your provider as well.   To learn more about what you can do with MyChart, go to [email or web address].    Your next appointment:   6 month(s)  The format for your next appointment:   In Person  Provider:   You may see Lauree Chandler, MD or one of the following Advanced Practice Providers on your designated Care Team:    Melina Copa, PA-C  Ermalinda Barrios, PA-C    Other Instructions None     Signed, Ermalinda Barrios, PA-C  05/07/2020 1:09 PM    [address], [place], [place]  [address] Phone: [phone]; Fax: [phone]

## 2020-05-07 ENCOUNTER — Encounter: Payer: Self-pay | Admitting: Physician Assistant

## 2020-05-07 ENCOUNTER — Other Ambulatory Visit: Payer: Self-pay

## 2020-05-07 ENCOUNTER — Ambulatory Visit (INDEPENDENT_AMBULATORY_CARE_PROVIDER_SITE_OTHER): Payer: Medicare Other | Admitting: Physician Assistant

## 2020-05-07 VITALS — BP 150/80 | HR 74 | Ht 69.0 in | Wt 145.0 lb

## 2020-05-07 DIAGNOSIS — I340 Nonrheumatic mitral (valve) insufficiency: Secondary | ICD-10-CM

## 2020-05-07 DIAGNOSIS — I10 Essential (primary) hypertension: Secondary | ICD-10-CM

## 2020-05-07 DIAGNOSIS — I25810 Atherosclerosis of coronary artery bypass graft(s) without angina pectoris: Secondary | ICD-10-CM | POA: Diagnosis not present

## 2020-05-07 DIAGNOSIS — I5032 Chronic diastolic (congestive) heart failure: Secondary | ICD-10-CM

## 2020-05-07 DIAGNOSIS — I480 Paroxysmal atrial fibrillation: Secondary | ICD-10-CM

## 2020-05-07 DIAGNOSIS — I739 Peripheral vascular disease, unspecified: Secondary | ICD-10-CM

## 2020-05-07 DIAGNOSIS — I255 Ischemic cardiomyopathy: Secondary | ICD-10-CM

## 2020-05-07 NOTE — Patient Instructions (Signed)
Medication Instructions:  Your physician recommends that you continue on your current medications as directed. Please refer to the Current Medication list given to you today.  *If you need a refill on your cardiac medications before your next appointment, please call your pharmacy*   Lab Work: None Today  If you have labs (blood work) drawn today and your tests are completely normal, you will receive your results only by: Marland Kitchen MyChart Message (if you have MyChart) OR . A paper copy in the mail If you have any lab test that is abnormal or we need to change your treatment, we will call you to review the results.   Testing/Procedures: None Today   Follow-Up: At [facility], you and your health needs are our priority.  As part of our continuing mission to provide you with exceptional heart care, we have created designated Provider Care Teams.  These Care Teams include your primary Cardiologist (physician) and Advanced Practice Providers (APPs -  Physician Assistants and Nurse Practitioners) who all work together to provide you with the care you need, when you need it.  We recommend signing up for the patient portal called "MyChart".  Sign up information is provided on this After Visit Summary.  MyChart is used to connect with patients for Virtual Visits (Telemedicine).  Patients are able to view lab/test results, encounter notes, upcoming appointments, etc.  Non-urgent messages can be sent to your provider as well.   To learn more about what you can do with MyChart, go to [email or web address].    Your next appointment:   6 month(s)  The format for your next appointment:   In Person  Provider:   You may see Lauree Chandler, MD or one of the following Advanced Practice Providers on your designated Care Team:    Melina Copa, PA-C  Ermalinda Barrios, PA-C    Other Instructions None

## 2020-05-08 DIAGNOSIS — D509 Iron deficiency anemia, unspecified: Secondary | ICD-10-CM | POA: Diagnosis not present

## 2020-05-08 DIAGNOSIS — I129 Hypertensive chronic kidney disease with stage 1 through stage 4 chronic kidney disease, or unspecified chronic kidney disease: Secondary | ICD-10-CM | POA: Diagnosis not present

## 2020-05-08 DIAGNOSIS — R21 Rash and other nonspecific skin eruption: Secondary | ICD-10-CM | POA: Diagnosis not present

## 2020-05-08 DIAGNOSIS — I2510 Atherosclerotic heart disease of native coronary artery without angina pectoris: Secondary | ICD-10-CM | POA: Diagnosis not present

## 2020-05-08 DIAGNOSIS — N1831 Chronic kidney disease, stage 3a: Secondary | ICD-10-CM | POA: Diagnosis not present

## 2020-05-08 DIAGNOSIS — I5020 Unspecified systolic (congestive) heart failure: Secondary | ICD-10-CM | POA: Diagnosis not present

## 2020-05-17 DIAGNOSIS — I872 Venous insufficiency (chronic) (peripheral): Secondary | ICD-10-CM | POA: Diagnosis not present

## 2020-05-17 DIAGNOSIS — L308 Other specified dermatitis: Secondary | ICD-10-CM | POA: Diagnosis not present

## 2020-05-29 DIAGNOSIS — Z9049 Acquired absence of other specified parts of digestive tract: Secondary | ICD-10-CM | POA: Diagnosis not present

## 2020-05-29 DIAGNOSIS — R194 Change in bowel habit: Secondary | ICD-10-CM | POA: Diagnosis not present

## 2020-05-29 DIAGNOSIS — Z8679 Personal history of other diseases of the circulatory system: Secondary | ICD-10-CM | POA: Diagnosis not present

## 2020-06-24 DIAGNOSIS — Z961 Presence of intraocular lens: Secondary | ICD-10-CM | POA: Diagnosis not present

## 2020-06-24 DIAGNOSIS — H40001 Preglaucoma, unspecified, right eye: Secondary | ICD-10-CM | POA: Diagnosis not present

## 2020-06-24 DIAGNOSIS — H17813 Minor opacity of cornea, bilateral: Secondary | ICD-10-CM | POA: Diagnosis not present

## 2020-07-08 DIAGNOSIS — E785 Hyperlipidemia, unspecified: Secondary | ICD-10-CM | POA: Diagnosis not present

## 2020-07-08 DIAGNOSIS — D509 Iron deficiency anemia, unspecified: Secondary | ICD-10-CM | POA: Diagnosis not present

## 2020-07-08 DIAGNOSIS — I5020 Unspecified systolic (congestive) heart failure: Secondary | ICD-10-CM | POA: Diagnosis not present

## 2020-07-08 DIAGNOSIS — I2510 Atherosclerotic heart disease of native coronary artery without angina pectoris: Secondary | ICD-10-CM | POA: Diagnosis not present

## 2020-07-08 DIAGNOSIS — Z0000 Encounter for general adult medical examination without abnormal findings: Secondary | ICD-10-CM | POA: Diagnosis not present

## 2020-07-08 DIAGNOSIS — N1831 Chronic kidney disease, stage 3a: Secondary | ICD-10-CM | POA: Diagnosis not present

## 2020-07-08 DIAGNOSIS — I739 Peripheral vascular disease, unspecified: Secondary | ICD-10-CM | POA: Diagnosis not present

## 2020-07-08 DIAGNOSIS — I129 Hypertensive chronic kidney disease with stage 1 through stage 4 chronic kidney disease, or unspecified chronic kidney disease: Secondary | ICD-10-CM | POA: Diagnosis not present

## 2020-07-08 DIAGNOSIS — I700 Atherosclerosis of aorta: Secondary | ICD-10-CM | POA: Diagnosis not present

## 2020-07-10 DIAGNOSIS — Z23 Encounter for immunization: Secondary | ICD-10-CM | POA: Diagnosis not present

## 2020-07-29 DIAGNOSIS — H17813 Minor opacity of cornea, bilateral: Secondary | ICD-10-CM | POA: Diagnosis not present

## 2020-07-29 DIAGNOSIS — H40001 Preglaucoma, unspecified, right eye: Secondary | ICD-10-CM | POA: Diagnosis not present

## 2020-07-29 DIAGNOSIS — Z961 Presence of intraocular lens: Secondary | ICD-10-CM | POA: Diagnosis not present

## 2020-08-12 DIAGNOSIS — R194 Change in bowel habit: Secondary | ICD-10-CM | POA: Diagnosis not present

## 2020-08-12 DIAGNOSIS — Z9049 Acquired absence of other specified parts of digestive tract: Secondary | ICD-10-CM | POA: Diagnosis not present

## 2020-08-12 DIAGNOSIS — Z8679 Personal history of other diseases of the circulatory system: Secondary | ICD-10-CM | POA: Diagnosis not present

## 2020-11-13 ENCOUNTER — Ambulatory Visit (INDEPENDENT_AMBULATORY_CARE_PROVIDER_SITE_OTHER): Payer: Medicare Other | Admitting: Cardiovascular Disease

## 2020-11-13 ENCOUNTER — Other Ambulatory Visit: Payer: Self-pay

## 2020-11-13 ENCOUNTER — Encounter: Payer: Self-pay | Admitting: Cardiovascular Disease

## 2020-11-13 VITALS — BP 152/70 | HR 70 | Ht 69.0 in | Wt 146.8 lb

## 2020-11-13 DIAGNOSIS — I5022 Chronic systolic (congestive) heart failure: Secondary | ICD-10-CM | POA: Diagnosis not present

## 2020-11-13 DIAGNOSIS — I480 Paroxysmal atrial fibrillation: Secondary | ICD-10-CM | POA: Diagnosis not present

## 2020-11-13 DIAGNOSIS — I739 Peripheral vascular disease, unspecified: Secondary | ICD-10-CM

## 2020-11-13 DIAGNOSIS — I10 Essential (primary) hypertension: Secondary | ICD-10-CM

## 2020-11-13 DIAGNOSIS — I340 Nonrheumatic mitral (valve) insufficiency: Secondary | ICD-10-CM | POA: Diagnosis not present

## 2020-11-13 DIAGNOSIS — I255 Ischemic cardiomyopathy: Secondary | ICD-10-CM | POA: Diagnosis not present

## 2020-11-13 DIAGNOSIS — F17201 Nicotine dependence, unspecified, in remission: Secondary | ICD-10-CM

## 2020-11-13 DIAGNOSIS — I25810 Atherosclerosis of coronary artery bypass graft(s) without angina pectoris: Secondary | ICD-10-CM | POA: Diagnosis not present

## 2020-11-13 NOTE — Progress Notes (Signed)
Evaluation Performed:  Follow-up visit  Date:  11/13/2020   ID:  Dean Bean, DOB 01/29/1931, MRN [id number]  Chief Complaint:   Chief Complaint  Patient presents with  . Follow-up    CAD   History of Present Illness:    Dean Bean is a 87 y.o. male with history of CKD, CAD s/p 4V CABG in 2013, atrial flutter/fib s/p ablation 2013, ischemic cardiomyopathy, chronic systolic CHF and COPD who is here today for cardiac follow up. I met him as a new patient to establish cardiology care on 10/02/16. He had been followed in [place] by Dr. Rozetta Nunnery but moved to [facility] at the end of 2017. Cardiac history includes 4V CABG in November 2013 (LIMA to LAD, SVG to OM, SVG to PDA, SVG to Diagonal) and stenting of the left main artery in 2016 (DES placed from left main into the diagonal not protected by LIMA graft). He had an atrial flutter ablation in 2013. He had an echo in 2016 with normal LVEF and mild MR. He is also known to have PAD. He was told he had blockages in his legs and was seen by vascular surgery but he says they said there was nothing to worry about. Former smoker (50 pack years, stopped smoking in 1998). He was admitted to [facility] April 2018 with dyspnea and was felt to be volume overloaded. He was diuresed with IV Lasix and had rapid improvement in his dyspnea. Echo 12/21/16 with LVEF=40-45% with mild MR. He was admitted to [facility] 09/08/19 with dyspnea, hypertensive emergency. He was diuresed for mild volume overload and started on Imdur. Echo 09/09/19 with LVEF=45-50%, unchanged. Mild MR. He was in [place] following his wife's death and had a rectal mass identified. This has since been removed. He had his colostomy reversed recently. He was admitted with acute CHF in [place] last week in the setting of a hypertensive crisis. He was anemic. He was diuresed with IV Lasix. Echo in [place] with TWSF=68-12%, grade 2 diastolic dysfunction.Marland Kitchen He was told there that he needed a MitraClip  for severe MR. His echo in December 2020 here and April 2021 in [place] showed mild to moderate MR. Echo July 2021 with LVEF=50-55%. Mild to moderate MR. He was admitted to [facility] July 2021 with volume overload. Losartan stopped in July 2021 due to rash. He was tried on Lisinopril but this was held due to worsening renal function.   He is here today for follow up. The patient denies any chest pain, dyspnea, palpitations, lower extremity edema, orthopnea, PND, dizziness, near syncope or syncope. He is very active.    Primary Care Physician: Harlan Stains, MD   Past Medical History:  Diagnosis Date  . Atrial fibrillation ([place])   . CAD (coronary artery disease)    s/p CABG  . COPD (chronic obstructive pulmonary disease) ([place])   . HTN (hypertension)   . Ischemic cardiomyopathy   . Mitral regurgitation   . Systolic CHF [facility])    Past Surgical History:  Procedure Laterality Date  . CORONARY ARTERY BYPASS GRAFT     4Vessel     Current Meds  Medication Sig  . Ascorbic Acid (VITAMIN C) 1000 MG tablet Take 1,000 mg by mouth daily.  . carvedilol (COREG) 6.25 MG tablet Take 6.25 mg by mouth 2 (two) times daily.  . clopidogrel (PLAVIX) 75 MG tablet Take 75 mg by mouth daily.  Marland Kitchen Dextromethorphan-guaiFENesin (CORICIDIN HBP CONGESTION/COUGH PO) Take 1 tablet by mouth as needed.   Marland Kitchen  furosemide (LASIX) 40 MG tablet Take 40 mg by mouth daily.   Marland Kitchen POLY-IRON 150 150 MG capsule Take 150 mg by mouth at bedtime.  . polycarbophil (FIBERCON) 625 MG tablet Take 625 mg by mouth daily.  . simvastatin (ZOCOR) 20 MG tablet Take 20 mg by mouth daily.  . tamsulosin (FLOMAX) 0.4 MG CAPS capsule   . [DISCONTINUED] potassium chloride SA (KLOR-CON) 20 MEQ tablet Take 1 tablet (20 mEq total) by mouth daily.     Allergies:   Losartan potassium   Social History   Tobacco Use  . Smoking status: Former Smoker    Packs/day: 1.00    Years: 50.00    Pack years: 50.00    Types: Cigarettes    Quit date: 10/02/1996     Years since quitting: 24.1  . Smokeless tobacco: Never Used  Vaping Use  . Vaping Use: Never used  Substance Use Topics  . Alcohol use: Yes    Alcohol/week: 7.0 standard drinks    Types: 7 Glasses of wine per week  . Drug use: No     Family Hx: The patient's family history includes Cancer in his mother; Heart attack (age of onset: 59) in his father.  ROS:   Please see the history of present illness.    All other systems reviewed and are negative.   Prior CV studies:   The following studies were reviewed today:  Echo July 2021:  1. Left ventricular ejection fraction, by estimation, is 50 to 55%. The  left ventricle has low normal function. The left ventricle demonstrates  regional wall motion abnormalities. Basal inferior/inferolateral  hypokinesis. There is mild left ventricular  hypertrophy. Left ventricular diastolic parameters are consistent with  Grade II diastolic dysfunction (pseudonormalization). Elevated left atrial  pressure.  2. Right ventricular systolic function is normal. The right ventricular  size is normal. Tricuspid regurgitation signal is inadequate for assessing  PA pressure.  3. Left atrial size was severely dilated.  4. Right atrial size was moderately dilated.  5. The mitral valve is normal in structure. Mild to moderate mitral valve  regurgitation. ERO 0.1 cm^2, RV 25cc  6. The aortic valve was not well visualized. Aortic valve regurgitation  is not visualized. No aortic stenosis is present.  7. The inferior vena cava is normal in size with greater than 50%  respiratory variability, suggesting right atrial pressure of 3 mmHg.    Labs/Other Tests and Data Reviewed:    EKG:  The EKG is not performed today The EKG is personally reviewed by me and shows   Recent Labs: 04/10/2020: B Natriuretic Peptide 781.4; Hemoglobin 12.0; Platelets 346 04/11/2020: Magnesium 2.0 04/12/2020: BUN 32; Creatinine, Ser 1.39; Potassium 3.9; Sodium 138   Recent  Lipid Panel No results found for: CHOL, TRIG, HDL, CHOLHDL, LDLCALC, LDLDIRECT  Wt Readings from Last 3 Encounters:  11/13/20 146 lb 12.8 oz (66.6 kg)  05/07/20 145 lb (65.8 kg)  04/12/20 143 lb 1.3 oz (64.9 kg)     Objective:    Vital Signs:  BP (!) 152/70   Pulse 70   Ht '5\' 9"'  (1.753 m)   Wt 146 lb 12.8 oz (66.6 kg)   SpO2 99%   BMI 21.68 kg/m    General: Well developed, well nourished, NAD  HEENT: OP clear, mucus membranes moist  SKIN: warm, dry. No rashes. Neuro: No focal deficits  Musculoskeletal: Muscle strength 5/5 all ext  Psychiatric: Mood and affect normal  Neck: No JVD, no carotid bruits, no  thyromegaly, no lymphadenopathy.  Lungs:Clear bilaterally, no wheezes, rhonci, crackles Cardiovascular: Regular rate and rhythm. Systolic murmur.  Abdomen:Soft. Bowel sounds present. Non-tender.  Extremities: No lower extremity edema. Pulses are 2 + in the bilateral DP/PT.   ASSESSMENT & PLAN:     1. CAD s/p CABG without angina: He had CABG in 2013 and then had stenting of his left main into the diagonal branch in 2016. This branch was unprotected by the grafts. He has no chest pain. No chest pain. Continue Plavix, statin and beta blocker  2. Ischemic cardiomyopathy: LV function normal by echo in July 2021. Continue beta blocker and Imdur. He did not tolerate Losartan due to a rash and has been off of Lisinopril due to renal insufficiency.    3. Chronic systolic CHF:  no volume overload on exam. Weight is stable. Continue Lasix.   4. PAD: He has known PAD by non-invasive studies at [facility] in 2014 with ABI 0.76 on the left and 0.46 on the right. We have spoken in the past about repeating ABI testing here but he has wished to delay. He will let us know if he has pain in his legs.    5. Atrial fibrillation/flutter: He is s/p ablation. He has not been on long term anticoagulation since he has has no recurrence of his AF following ablation. No palpitations. Continue beta  blocker  6. Former tobacco abuse: He stopped smoking in 1998.   7. Mitral regurgitation: Mild to moderate by echo July 2021.    8. HTN: BP is well controlled at home. Continue current therapy  Medication Adjustments/Labs and Tests Ordered: Current medicines are reviewed at length with the patient today.  Concerns regarding medicines are outlined above.   Tests Ordered: No orders of the defined types were placed in this encounter.   Medication Changes: No orders of the defined types were placed in this encounter.   Disposition:  Follow up in 6 month(s)  Signed, Lauree Chandler, MD  11/13/2020 8:36 AM    [facility]

## 2020-11-13 NOTE — Patient Instructions (Signed)

## 2020-12-18 DIAGNOSIS — H40001 Preglaucoma, unspecified, right eye: Secondary | ICD-10-CM | POA: Diagnosis not present

## 2020-12-18 DIAGNOSIS — Z961 Presence of intraocular lens: Secondary | ICD-10-CM | POA: Diagnosis not present

## 2020-12-18 DIAGNOSIS — H17813 Minor opacity of cornea, bilateral: Secondary | ICD-10-CM | POA: Diagnosis not present

## 2020-12-18 DIAGNOSIS — H04123 Dry eye syndrome of bilateral lacrimal glands: Secondary | ICD-10-CM | POA: Diagnosis not present

## 2021-01-06 DIAGNOSIS — I5020 Unspecified systolic (congestive) heart failure: Secondary | ICD-10-CM | POA: Diagnosis not present

## 2021-01-06 DIAGNOSIS — I739 Peripheral vascular disease, unspecified: Secondary | ICD-10-CM | POA: Diagnosis not present

## 2021-01-06 DIAGNOSIS — N1831 Chronic kidney disease, stage 3a: Secondary | ICD-10-CM | POA: Diagnosis not present

## 2021-01-06 DIAGNOSIS — I2510 Atherosclerotic heart disease of native coronary artery without angina pectoris: Secondary | ICD-10-CM | POA: Diagnosis not present

## 2021-01-06 DIAGNOSIS — I129 Hypertensive chronic kidney disease with stage 1 through stage 4 chronic kidney disease, or unspecified chronic kidney disease: Secondary | ICD-10-CM | POA: Diagnosis not present

## 2021-01-06 DIAGNOSIS — E785 Hyperlipidemia, unspecified: Secondary | ICD-10-CM | POA: Diagnosis not present

## 2021-01-06 DIAGNOSIS — I700 Atherosclerosis of aorta: Secondary | ICD-10-CM | POA: Diagnosis not present

## 2021-01-17 ENCOUNTER — Ambulatory Visit: Payer: Medicare Other | Admitting: Cardiovascular Disease

## 2021-01-29 DIAGNOSIS — Z8679 Personal history of other diseases of the circulatory system: Secondary | ICD-10-CM | POA: Diagnosis not present

## 2021-01-29 DIAGNOSIS — Z9049 Acquired absence of other specified parts of digestive tract: Secondary | ICD-10-CM | POA: Diagnosis not present

## 2021-01-29 DIAGNOSIS — R194 Change in bowel habit: Secondary | ICD-10-CM | POA: Diagnosis not present

## 2021-01-29 DIAGNOSIS — Z85048 Personal history of other malignant neoplasm of rectum, rectosigmoid junction, and anus: Secondary | ICD-10-CM | POA: Diagnosis not present

## 2021-01-30 ENCOUNTER — Telehealth: Payer: Self-pay

## 2021-01-30 NOTE — Telephone Encounter (Signed)
Dean Bean 87 year old male is requesting preoperative cardiac evaluation for colonoscopy.  He was last seen in clinic on 11/13/2020.  During that time he was doing well.  He denied chest pain, dyspnea, palpitations, lower extremity edema, orthopnea, PND, and dizziness.  He reported he was very active.  His PMH includes CKD, coronary artery disease status post CABG x4 in 2013, atrial flutter/fibrillation status post ablation 2013, ischemic cardiomyopathy, chronic systolic CHF, and COPD.  May his Plavix be held prior to his procedure?  Thank you for your help.  Please direct response to CV DIV preop pool.  Jossie Ng. Dessie Tatem NP-C    01/30/2021, 3:08 PM [place] [address] Office [phone] Fax [phone]

## 2021-01-30 NOTE — Telephone Encounter (Signed)
   [facility] Pre-operative Risk Assessment    Patient Name: Jettie Lazare  DOB: Apr 20, 1931  MRN: [id number]   HEARTCARE STAFF: - Please ensure there is not already an duplicate clearance open for this procedure. - Under Visit Info/Reason for Call, type in Other and utilize the format Clearance MM/DD/YY or Clearance TBD. Do not use dashes or single digits. - If request is for dental extraction, please clarify the # of teeth to be extracted.  Request for surgical clearance:  1. What type of surgery is being performed? COLONOSCOPY   2. When is this surgery scheduled? 05/14/21   3. What type of clearance is required (medical clearance vs. Pharmacy clearance to hold med vs. Both)? PHARAMCY  4. Are there any medications that need to be held prior to surgery and how long? PLAVIX, HOLD FOR 5 DAYS PRIOR TO PROCEDURE    5. Practice name and name of physician performing surgery? [facility], DR. Alessandra Bevels     6. What is the office phone number? [phone]   7.   What is the office fax number? [phone]  8.   Anesthesia type (None, local, MAC, general) ? PROPOFOL   Jacinta Shoe 01/30/2021, 1:41 PM  _________________________________________________________________   (provider comments below)

## 2021-01-31 DIAGNOSIS — Z23 Encounter for immunization: Secondary | ICD-10-CM | POA: Diagnosis not present

## 2021-01-31 NOTE — Telephone Encounter (Signed)
OK to hold Plavix prior to his procedure. Dean Bean  

## 2021-01-31 NOTE — Telephone Encounter (Signed)
   Primary Cardiologist: Lauree Chandler, MD  Chart reviewed as part of pre-operative protocol coverage. Given past medical history and time since last visit, based on ACC/AHA guidelines, Dean Bean would be at acceptable risk for the planned procedure without further cardiovascular testing.   His Plavix may be held for 5-7 days prior to his procedure.  Please resume as soon as hemostasis is achieved  I will route this recommendation to the requesting party via Epic fax function and remove from pre-op pool.  Please call with questions.  Jossie Ng. Hezzie Karim NP-C    01/31/2021, 10:55 AM [place] [address] Office [phone] Fax [phone]

## 2021-04-21 DIAGNOSIS — H40001 Preglaucoma, unspecified, right eye: Secondary | ICD-10-CM | POA: Diagnosis not present

## 2021-04-21 DIAGNOSIS — Z961 Presence of intraocular lens: Secondary | ICD-10-CM | POA: Diagnosis not present

## 2021-05-14 DIAGNOSIS — Z85038 Personal history of other malignant neoplasm of large intestine: Secondary | ICD-10-CM | POA: Diagnosis not present

## 2021-05-14 DIAGNOSIS — K5730 Diverticulosis of large intestine without perforation or abscess without bleeding: Secondary | ICD-10-CM | POA: Diagnosis not present

## 2021-05-14 DIAGNOSIS — Z980 Intestinal bypass and anastomosis status: Secondary | ICD-10-CM | POA: Diagnosis not present

## 2021-05-14 DIAGNOSIS — K621 Rectal polyp: Secondary | ICD-10-CM | POA: Diagnosis not present

## 2021-05-14 DIAGNOSIS — D125 Benign neoplasm of sigmoid colon: Secondary | ICD-10-CM | POA: Diagnosis not present

## 2021-05-14 DIAGNOSIS — K648 Other hemorrhoids: Secondary | ICD-10-CM | POA: Diagnosis not present

## 2021-05-15 NOTE — Progress Notes (Signed)
Evaluation Performed:  Follow-up visit  Date:  05/16/2021   ID:  Dean Bean, DOB 04/15/1931, MRN [id number]  Chief Complaint:   Chief Complaint  Patient presents with   Follow-up    CAD    History of Present Illness:    Dean Bean is a 87 y.o. male with history of CKD, CAD s/p 4V CABG in 2013, atrial flutter/fib s/p ablation 2013, ischemic cardiomyopathy, chronic systolic CHF and COPD who is here today for cardiac follow up. I met him as a new patient to establish cardiology care on 10/02/16. He had been followed in [place] by Dr. Rozetta Nunnery but moved to [facility] at the end of 2017. Cardiac history includes 4V CABG in November 2013 (LIMA to LAD, SVG to OM, SVG to PDA, SVG to Diagonal) and stenting of the left main artery in 2016 (DES placed from left main into the diagonal not protected by LIMA graft). He had an atrial flutter ablation in 2013. He had an echo in 2016 with normal LVEF and mild MR. He is also known to have PAD. He was told he had blockages in his legs and was seen by vascular surgery but he says they said there was nothing to worry about. Former smoker (50 pack years, stopped smoking in 1998). He was admitted to [facility] April 2018 with dyspnea and was felt to be volume overloaded. He was diuresed with IV Lasix and had rapid improvement in his dyspnea. Echo 12/21/16 with LVEF=40-45% with mild MR. He was admitted to [facility] 09/08/19 with dyspnea, hypertensive emergency. He was diuresed for mild volume overload and started on Imdur. Echo 09/09/19 with LVEF=45-50%, unchanged. Mild MR. He was in [place] following his wife's death and had a rectal mass identified. This has since been removed. He had his colostomy reversed recently. He was admitted with acute CHF in [place] last week in the setting of a hypertensive crisis. He was anemic. He was diuresed with IV Lasix. Echo in [place] with WUJW=11-91%, grade 2 diastolic dysfunction.Marland Kitchen He was told there that he needed a MitraClip  for severe MR. His echo in December 2020 here and April 2021 in [place] showed mild to moderate MR. Echo July 2021 with LVEF=50-55%. Mild to moderate MR. He was admitted to [facility] July 2021 with volume overload. Losartan stopped in July 2021 due to rash. He was tried on Lisinopril but this was held due to worsening renal function.   He is here today for follow up. The patient denies any chest pain, dyspnea, palpitations, lower extremity edema, orthopnea, PND, dizziness, near syncope or syncope.    Primary Care Physician: Harlan Stains, MD   Past Medical History:  Diagnosis Date   Atrial fibrillation [facility])    CAD (coronary artery disease)    s/p CABG   COPD (chronic obstructive pulmonary disease) ([facility])    HTN (hypertension)    Ischemic cardiomyopathy    Mitral regurgitation    Systolic CHF [facility])    Past Surgical History:  Procedure Laterality Date   CORONARY ARTERY BYPASS GRAFT     4Vessel     Current Meds  Medication Sig   carvedilol (COREG) 6.25 MG tablet Take 6.25 mg by mouth 2 (two) times daily.   clopidogrel (PLAVIX) 75 MG tablet Take 75 mg by mouth daily.   Dextromethorphan-guaiFENesin (CORICIDIN HBP CONGESTION/COUGH PO) Take 1 tablet by mouth as needed.    Ferrous Sulfate (IRON PO) Take 65 mg by mouth daily.   furosemide (LASIX) 40 MG  tablet Take 40 mg by mouth daily.    simvastatin (ZOCOR) 20 MG tablet Take 20 mg by mouth daily.   tamsulosin (FLOMAX) 0.4 MG CAPS capsule    [DISCONTINUED] POLY-IRON 150 150 MG capsule Take 150 mg by mouth at bedtime.   [DISCONTINUED] polycarbophil (FIBERCON) 625 MG tablet Take 625 mg by mouth daily.     Allergies:   Losartan potassium   Social History   Tobacco Use   Smoking status: Former    Packs/day: 1.00    Years: 50.00    Pack years: 50.00    Types: Cigarettes    Quit date: 10/02/1996    Years since quitting: 24.6   Smokeless tobacco: Never  Vaping Use   Vaping Use: Never used  Substance Use Topics   Alcohol use: Yes     Alcohol/week: 7.0 standard drinks    Types: 7 Glasses of wine per week   Drug use: No     Family Hx: The patient's family history includes Cancer in his mother; Heart attack (age of onset: 87) in his father.  ROS:   Please see the history of present illness.    All other systems reviewed and are negative.   Prior CV studies:   The following studies were reviewed today:  Echo July 2021:   1. Left ventricular ejection fraction, by estimation, is 50 to 55%. The  left ventricle has low normal function. The left ventricle demonstrates  regional wall motion abnormalities. Basal inferior/inferolateral  hypokinesis. There is mild left ventricular  hypertrophy. Left ventricular diastolic parameters are consistent with  Grade II diastolic dysfunction (pseudonormalization). Elevated left atrial  pressure.   2. Right ventricular systolic function is normal. The right ventricular  size is normal. Tricuspid regurgitation signal is inadequate for assessing  PA pressure.   3. Left atrial size was severely dilated.   4. Right atrial size was moderately dilated.   5. The mitral valve is normal in structure. Mild to moderate mitral valve  regurgitation. ERO 0.1 cm^2, RV 25cc   6. The aortic valve was not well visualized. Aortic valve regurgitation  is not visualized. No aortic stenosis is present.   7. The inferior vena cava is normal in size with greater than 50%  respiratory variability, suggesting right atrial pressure of 3 mmHg.     Labs/Other Tests and Data Reviewed:    EKG:  The EKG is performed today The EKG is personally reviewed by me and shows sinus with LVH  Recent Labs: No results found for requested labs within last 8760 hours.   Recent Lipid Panel No results found for: CHOL, TRIG, HDL, CHOLHDL, LDLCALC, LDLDIRECT  Wt Readings from Last 3 Encounters:  11/13/20 146 lb 12.8 oz (66.6 kg)  05/07/20 145 lb (65.8 kg)  04/12/20 143 lb 1.3 oz (64.9 kg)     Objective:     Vital Signs:  There were no vitals taken for this visit.   General: Well developed, well nourished, NAD  HEENT: OP clear, mucus membranes moist  SKIN: warm, dry. No rashes. Neuro: No focal deficits  Musculoskeletal: Muscle strength 5/5 all ext  Psychiatric: Mood and affect normal  Neck: No JVD, no carotid bruits, no thyromegaly, no lymphadenopathy.  Lungs:Clear bilaterally, no wheezes, rhonci, crackles Cardiovascular: Regular rate and rhythm. No murmurs, gallops or rubs. Abdomen:Soft. Bowel sounds present. Non-tender.  Extremities: No lower extremity edema. Pulses are 2 + in the bilateral DP/PT.   ASSESSMENT & PLAN:      1. CAD  s/p CABG without angina: He had CABG in 2013 and then had stenting of his left main into the diagonal branch in 2016. This branch was unprotected by the grafts. No chest pain Will continue Plavix, statin and beta blocker.    2. Ischemic cardiomyopathy: LV function normal by echo in July 2021. He did not tolerate Losartan due to a rash and has been off of Lisinopril due to renal insufficiency.  Continue beta blocker and Imdur   3. Chronic systolic CHF:  Weight is stable. No volume overload on exam. Will continue Lasix 40 mg several days per week.   4. PAD: He has known PAD by non-invasive studies at [facility] in 2014 with ABI 0.76 on the left and 0.46 on the right. We have spoken in the past about repeating ABI testing here but he has wished to delay. He will let us know if he has pain in his legs.     5. Atrial fibrillation/flutter: He is s/p ablation. He has not been on long term anticoagulation since he has has no recurrence of his AF following ablation. He has no palpitations. Continue beta blocker   6. Former tobacco abuse: He stopped smoking in 1998.    7. Mitral regurgitation: Mild to moderate by echo July 2021.  Repeat echo in July 2023   8. HTN: BP is controlled at home. No changes in therapy today.   Medication Adjustments/Labs and Tests  Ordered: Current medicines are reviewed at length with the patient today.  Concerns regarding medicines are outlined above.   Tests Ordered: Orders Placed This Encounter  Procedures   EKG 12-Lead     Medication Changes: No orders of the defined types were placed in this encounter.   Disposition:  Follow up with me in one year.   Signed, Lauree Chandler, MD  05/16/2021 10:20 AM    [place]

## 2021-05-16 ENCOUNTER — Ambulatory Visit (INDEPENDENT_AMBULATORY_CARE_PROVIDER_SITE_OTHER): Payer: Medicare Other | Admitting: Cardiovascular Disease

## 2021-05-16 ENCOUNTER — Other Ambulatory Visit: Payer: Self-pay

## 2021-05-16 DIAGNOSIS — I480 Paroxysmal atrial fibrillation: Secondary | ICD-10-CM | POA: Diagnosis not present

## 2021-05-16 DIAGNOSIS — I340 Nonrheumatic mitral (valve) insufficiency: Secondary | ICD-10-CM | POA: Diagnosis not present

## 2021-05-16 DIAGNOSIS — I10 Essential (primary) hypertension: Secondary | ICD-10-CM | POA: Diagnosis not present

## 2021-05-16 DIAGNOSIS — I255 Ischemic cardiomyopathy: Secondary | ICD-10-CM | POA: Diagnosis not present

## 2021-05-16 DIAGNOSIS — I5022 Chronic systolic (congestive) heart failure: Secondary | ICD-10-CM | POA: Diagnosis not present

## 2021-05-16 DIAGNOSIS — I25810 Atherosclerosis of coronary artery bypass graft(s) without angina pectoris: Secondary | ICD-10-CM

## 2021-05-16 DIAGNOSIS — I739 Peripheral vascular disease, unspecified: Secondary | ICD-10-CM

## 2021-05-16 NOTE — Patient Instructions (Signed)

## 2021-05-20 DIAGNOSIS — D125 Benign neoplasm of sigmoid colon: Secondary | ICD-10-CM | POA: Diagnosis not present

## 2021-05-20 DIAGNOSIS — K621 Rectal polyp: Secondary | ICD-10-CM | POA: Diagnosis not present

## 2021-07-01 DIAGNOSIS — H0102A Squamous blepharitis right eye, upper and lower eyelids: Secondary | ICD-10-CM | POA: Diagnosis not present

## 2021-07-01 DIAGNOSIS — Z961 Presence of intraocular lens: Secondary | ICD-10-CM | POA: Diagnosis not present

## 2021-07-01 DIAGNOSIS — H0102B Squamous blepharitis left eye, upper and lower eyelids: Secondary | ICD-10-CM | POA: Diagnosis not present

## 2021-07-01 DIAGNOSIS — H40001 Preglaucoma, unspecified, right eye: Secondary | ICD-10-CM | POA: Diagnosis not present

## 2021-07-09 DIAGNOSIS — N1831 Chronic kidney disease, stage 3a: Secondary | ICD-10-CM | POA: Diagnosis not present

## 2021-07-09 DIAGNOSIS — I5020 Unspecified systolic (congestive) heart failure: Secondary | ICD-10-CM | POA: Diagnosis not present

## 2021-07-09 DIAGNOSIS — I700 Atherosclerosis of aorta: Secondary | ICD-10-CM | POA: Diagnosis not present

## 2021-07-09 DIAGNOSIS — Z85048 Personal history of other malignant neoplasm of rectum, rectosigmoid junction, and anus: Secondary | ICD-10-CM | POA: Diagnosis not present

## 2021-07-09 DIAGNOSIS — I739 Peripheral vascular disease, unspecified: Secondary | ICD-10-CM | POA: Diagnosis not present

## 2021-07-09 DIAGNOSIS — E785 Hyperlipidemia, unspecified: Secondary | ICD-10-CM | POA: Diagnosis not present

## 2021-07-09 DIAGNOSIS — I129 Hypertensive chronic kidney disease with stage 1 through stage 4 chronic kidney disease, or unspecified chronic kidney disease: Secondary | ICD-10-CM | POA: Diagnosis not present

## 2021-07-09 DIAGNOSIS — N400 Enlarged prostate without lower urinary tract symptoms: Secondary | ICD-10-CM | POA: Diagnosis not present

## 2021-07-09 DIAGNOSIS — Z0000 Encounter for general adult medical examination without abnormal findings: Secondary | ICD-10-CM | POA: Diagnosis not present

## 2021-07-09 DIAGNOSIS — I2510 Atherosclerotic heart disease of native coronary artery without angina pectoris: Secondary | ICD-10-CM | POA: Diagnosis not present

## 2021-07-11 DIAGNOSIS — Z23 Encounter for immunization: Secondary | ICD-10-CM | POA: Diagnosis not present

## 2021-07-22 DIAGNOSIS — Z23 Encounter for immunization: Secondary | ICD-10-CM | POA: Diagnosis not present

## 2021-09-12 DIAGNOSIS — J101 Influenza due to other identified influenza virus with other respiratory manifestations: Secondary | ICD-10-CM | POA: Diagnosis not present

## 2021-09-25 DIAGNOSIS — J40 Bronchitis, not specified as acute or chronic: Secondary | ICD-10-CM | POA: Diagnosis not present

## 2021-12-31 DIAGNOSIS — Z961 Presence of intraocular lens: Secondary | ICD-10-CM | POA: Diagnosis not present

## 2021-12-31 DIAGNOSIS — H35361 Drusen (degenerative) of macula, right eye: Secondary | ICD-10-CM | POA: Diagnosis not present

## 2021-12-31 DIAGNOSIS — H40001 Preglaucoma, unspecified, right eye: Secondary | ICD-10-CM | POA: Diagnosis not present

## 2022-01-07 DIAGNOSIS — I129 Hypertensive chronic kidney disease with stage 1 through stage 4 chronic kidney disease, or unspecified chronic kidney disease: Secondary | ICD-10-CM | POA: Diagnosis not present

## 2022-01-07 DIAGNOSIS — E785 Hyperlipidemia, unspecified: Secondary | ICD-10-CM | POA: Diagnosis not present

## 2022-01-07 DIAGNOSIS — I700 Atherosclerosis of aorta: Secondary | ICD-10-CM | POA: Diagnosis not present

## 2022-01-07 DIAGNOSIS — N1831 Chronic kidney disease, stage 3a: Secondary | ICD-10-CM | POA: Diagnosis not present

## 2022-01-07 DIAGNOSIS — I2510 Atherosclerotic heart disease of native coronary artery without angina pectoris: Secondary | ICD-10-CM | POA: Diagnosis not present

## 2022-01-07 DIAGNOSIS — I739 Peripheral vascular disease, unspecified: Secondary | ICD-10-CM | POA: Diagnosis not present

## 2022-01-07 DIAGNOSIS — I5020 Unspecified systolic (congestive) heart failure: Secondary | ICD-10-CM | POA: Diagnosis not present

## 2022-01-07 DIAGNOSIS — N400 Enlarged prostate without lower urinary tract symptoms: Secondary | ICD-10-CM | POA: Diagnosis not present

## 2022-04-10 DIAGNOSIS — R000 Tachycardia, unspecified: Secondary | ICD-10-CM | POA: Diagnosis not present

## 2022-04-10 DIAGNOSIS — Z955 Presence of coronary angioplasty implant and graft: Secondary | ICD-10-CM | POA: Diagnosis not present

## 2022-04-10 DIAGNOSIS — I454 Nonspecific intraventricular block: Secondary | ICD-10-CM | POA: Diagnosis not present

## 2022-04-10 DIAGNOSIS — I130 Hypertensive heart and chronic kidney disease with heart failure and stage 1 through stage 4 chronic kidney disease, or unspecified chronic kidney disease: Secondary | ICD-10-CM | POA: Diagnosis not present

## 2022-04-10 DIAGNOSIS — J441 Chronic obstructive pulmonary disease with (acute) exacerbation: Secondary | ICD-10-CM | POA: Diagnosis not present

## 2022-04-10 DIAGNOSIS — Z7689 Persons encountering health services in other specified circumstances: Secondary | ICD-10-CM | POA: Diagnosis not present

## 2022-04-10 DIAGNOSIS — J9601 Acute respiratory failure with hypoxia: Secondary | ICD-10-CM | POA: Diagnosis not present

## 2022-04-10 DIAGNOSIS — Z743 Need for continuous supervision: Secondary | ICD-10-CM | POA: Diagnosis not present

## 2022-04-10 DIAGNOSIS — I509 Heart failure, unspecified: Secondary | ICD-10-CM | POA: Diagnosis not present

## 2022-04-10 DIAGNOSIS — J90 Pleural effusion, not elsewhere classified: Secondary | ICD-10-CM | POA: Diagnosis not present

## 2022-04-10 DIAGNOSIS — R739 Hyperglycemia, unspecified: Secondary | ICD-10-CM | POA: Diagnosis not present

## 2022-04-10 DIAGNOSIS — I5033 Acute on chronic diastolic (congestive) heart failure: Secondary | ICD-10-CM | POA: Diagnosis not present

## 2022-04-10 DIAGNOSIS — I255 Ischemic cardiomyopathy: Secondary | ICD-10-CM | POA: Diagnosis not present

## 2022-04-10 DIAGNOSIS — R9431 Abnormal electrocardiogram [ECG] [EKG]: Secondary | ICD-10-CM | POA: Diagnosis not present

## 2022-04-10 DIAGNOSIS — I214 Non-ST elevation (NSTEMI) myocardial infarction: Secondary | ICD-10-CM | POA: Diagnosis not present

## 2022-04-10 DIAGNOSIS — B9689 Other specified bacterial agents as the cause of diseases classified elsewhere: Secondary | ICD-10-CM | POA: Diagnosis not present

## 2022-04-10 DIAGNOSIS — E785 Hyperlipidemia, unspecified: Secondary | ICD-10-CM | POA: Diagnosis not present

## 2022-04-10 DIAGNOSIS — I491 Atrial premature depolarization: Secondary | ICD-10-CM | POA: Diagnosis not present

## 2022-04-10 DIAGNOSIS — R0602 Shortness of breath: Secondary | ICD-10-CM | POA: Diagnosis not present

## 2022-04-10 DIAGNOSIS — R0603 Acute respiratory distress: Secondary | ICD-10-CM | POA: Diagnosis not present

## 2022-04-10 DIAGNOSIS — I2510 Atherosclerotic heart disease of native coronary artery without angina pectoris: Secondary | ICD-10-CM | POA: Diagnosis not present

## 2022-04-10 DIAGNOSIS — I5043 Acute on chronic combined systolic (congestive) and diastolic (congestive) heart failure: Secondary | ICD-10-CM | POA: Diagnosis not present

## 2022-04-10 DIAGNOSIS — R918 Other nonspecific abnormal finding of lung field: Secondary | ICD-10-CM | POA: Diagnosis not present

## 2022-04-10 DIAGNOSIS — I445 Left posterior fascicular block: Secondary | ICD-10-CM | POA: Diagnosis not present

## 2022-04-10 DIAGNOSIS — J9811 Atelectasis: Secondary | ICD-10-CM | POA: Diagnosis not present

## 2022-04-10 DIAGNOSIS — I5023 Acute on chronic systolic (congestive) heart failure: Secondary | ICD-10-CM | POA: Diagnosis not present

## 2022-04-10 DIAGNOSIS — Z85038 Personal history of other malignant neoplasm of large intestine: Secondary | ICD-10-CM | POA: Diagnosis not present

## 2022-04-10 DIAGNOSIS — Z951 Presence of aortocoronary bypass graft: Secondary | ICD-10-CM | POA: Diagnosis not present

## 2022-04-10 DIAGNOSIS — I440 Atrioventricular block, first degree: Secondary | ICD-10-CM | POA: Diagnosis not present

## 2022-04-10 DIAGNOSIS — J9602 Acute respiratory failure with hypercapnia: Secondary | ICD-10-CM | POA: Diagnosis not present

## 2022-04-10 DIAGNOSIS — I2119 ST elevation (STEMI) myocardial infarction involving other coronary artery of inferior wall: Secondary | ICD-10-CM | POA: Diagnosis not present

## 2022-04-10 DIAGNOSIS — Z20822 Contact with and (suspected) exposure to covid-19: Secondary | ICD-10-CM | POA: Diagnosis not present

## 2022-04-10 DIAGNOSIS — N183 Chronic kidney disease, stage 3 unspecified: Secondary | ICD-10-CM | POA: Diagnosis not present

## 2022-04-10 DIAGNOSIS — K8020 Calculus of gallbladder without cholecystitis without obstruction: Secondary | ICD-10-CM | POA: Diagnosis not present

## 2022-04-10 DIAGNOSIS — I4891 Unspecified atrial fibrillation: Secondary | ICD-10-CM | POA: Diagnosis not present

## 2022-04-10 DIAGNOSIS — Z7902 Long term (current) use of antithrombotics/antiplatelets: Secondary | ICD-10-CM | POA: Diagnosis not present

## 2022-04-10 DIAGNOSIS — R7989 Other specified abnormal findings of blood chemistry: Secondary | ICD-10-CM | POA: Diagnosis not present

## 2022-04-10 DIAGNOSIS — I4581 Long QT syndrome: Secondary | ICD-10-CM | POA: Diagnosis not present

## 2022-05-19 ENCOUNTER — Encounter: Payer: Self-pay | Admitting: Cardiovascular Disease

## 2022-05-19 ENCOUNTER — Ambulatory Visit: Payer: Medicare Other | Attending: Cardiovascular Disease | Admitting: Cardiovascular Disease

## 2022-05-19 VITALS — BP 134/62 | HR 72 | Ht 69.0 in | Wt 145.2 lb

## 2022-05-19 DIAGNOSIS — I480 Paroxysmal atrial fibrillation: Secondary | ICD-10-CM

## 2022-05-19 DIAGNOSIS — I10 Essential (primary) hypertension: Secondary | ICD-10-CM

## 2022-05-19 DIAGNOSIS — I25810 Atherosclerosis of coronary artery bypass graft(s) without angina pectoris: Secondary | ICD-10-CM

## 2022-05-19 DIAGNOSIS — I255 Ischemic cardiomyopathy: Secondary | ICD-10-CM

## 2022-05-19 DIAGNOSIS — I739 Peripheral vascular disease, unspecified: Secondary | ICD-10-CM | POA: Diagnosis not present

## 2022-05-19 DIAGNOSIS — I340 Nonrheumatic mitral (valve) insufficiency: Secondary | ICD-10-CM

## 2022-05-19 DIAGNOSIS — I5022 Chronic systolic (congestive) heart failure: Secondary | ICD-10-CM

## 2022-05-19 NOTE — Progress Notes (Signed)
Evaluation Performed:  Follow-up visit  Date:  05/20/2022   ID:  [name], DOB 03-30-1931, MRN [id number]  Chief Complaint:   Chief Complaint  Patient presents with   Follow-up    CAD    History of Present Illness:    Dean Bean is a 88 y.o. male with history of CKD, CAD s/p 4V CABG in 2013, atrial flutter/fib s/p ablation 2013, ischemic cardiomyopathy, chronic systolic CHF and COPD who is here today for cardiac follow up. I met him as a new patient to establish cardiology care on 10/02/16. He had been followed in [place] by Dr. Rozetta Nunnery but moved to [facility] at the end of 2017. Cardiac history includes 4V CABG in November 2013 (LIMA to LAD, SVG to OM, SVG to PDA, SVG to Diagonal) and stenting of the left main artery in 2016 (DES placed from left main into the diagonal not protected by LIMA graft). He had an atrial flutter ablation in 2013. He had an echo in 2016 with normal LVEF and mild MR. He is also known to have PAD. He was told he had blockages in his legs and was seen by vascular surgery but he says they said there was nothing to worry about. Former smoker (50 pack years, stopped smoking in 1998). He was admitted to [facility] April 2018 with dyspnea and was felt to be volume overloaded. He was diuresed with IV Lasix and had rapid improvement in his dyspnea. Echo 12/21/16 with LVEF=40-45% with mild MR. He was admitted to [facility] 09/08/19 with dyspnea, hypertensive emergency. He was diuresed for mild volume overload and started on Imdur. Echo 09/09/19 with LVEF=45-50%, unchanged. Mild MR. He was in [place] following his wife's death and had a rectal mass identified. This has since been removed. He had his colostomy reversed recently. He was admitted with acute CHF in [place] last week in the setting of a hypertensive crisis. He was anemic. He was diuresed with IV Lasix. Echo in [place] with QPRF=16-38%, grade 2 diastolic dysfunction.Marland Kitchen He was told there that he needed a MitraClip  for severe MR. His echo in December 2020 here and April 2021 in [place] showed mild to moderate MR. Echo July 2021 with LVEF=50-55%. Mild to moderate MR. He was admitted to [facility] July 2021 with volume overload. Losartan stopped in July 2021 due to rash. He was tried on Lisinopril but this was held due to worsening renal function. He was admitted in [place] with CHF in July 2023. Echo there with LVEF=55%, mild to moderate MR. He was diuresed and has done well.   He is here today for follow up. The patient denies any chest pain, dyspnea, palpitations, lower extremity edema, orthopnea, PND, dizziness, near syncope or syncope.   Primary Care Physician: Harlan Stains, MD   Past Medical History:  Diagnosis Date   Atrial fibrillation [facility])    CAD (coronary artery disease)    s/p CABG   COPD (chronic obstructive pulmonary disease) (HCC)    HTN (hypertension)    Ischemic cardiomyopathy    Mitral regurgitation    Systolic CHF [facility])    Past Surgical History:  Procedure Laterality Date   CORONARY ARTERY BYPASS GRAFT     4Vessel     Current Meds  Medication Sig   carvedilol (COREG) 6.25 MG tablet Take 6.25 mg by mouth 2 (two) times daily.   clopidogrel (PLAVIX) 75 MG tablet Take 75 mg by mouth daily.   furosemide (LASIX) 40 MG tablet Take 40  mg by mouth daily.    simvastatin (ZOCOR) 20 MG tablet Take 20 mg by mouth daily.   tamsulosin (FLOMAX) 0.4 MG CAPS capsule      Allergies:   Losartan potassium   Social History   Tobacco Use   Smoking status: Former    Packs/day: 1.00    Years: 50.00    Total pack years: 50.00    Types: Cigarettes    Quit date: 10/02/1996    Years since quitting: 25.6   Smokeless tobacco: Never  Vaping Use   Vaping Use: Never used  Substance Use Topics   Alcohol use: Yes    Alcohol/week: 7.0 standard drinks of alcohol    Types: 7 Glasses of wine per week   Drug use: No     Family Hx: The patient's family history includes Cancer in his mother; Heart  attack (age of onset: 97) in his father.  ROS:   Please see the history of present illness.    All other systems reviewed and are negative.   Prior CV studies:   The following studies were reviewed today:  Echo July 2021:   1. Left ventricular ejection fraction, by estimation, is 50 to 55%. The  left ventricle has low normal function. The left ventricle demonstrates  regional wall motion abnormalities. Basal inferior/inferolateral  hypokinesis. There is mild left ventricular  hypertrophy. Left ventricular diastolic parameters are consistent with  Grade II diastolic dysfunction (pseudonormalization). Elevated left atrial  pressure.   2. Right ventricular systolic function is normal. The right ventricular  size is normal. Tricuspid regurgitation signal is inadequate for assessing  PA pressure.   3. Left atrial size was severely dilated.   4. Right atrial size was moderately dilated.   5. The mitral valve is normal in structure. Mild to moderate mitral valve  regurgitation. ERO 0.1 cm^2, RV 25cc   6. The aortic valve was not well visualized. Aortic valve regurgitation  is not visualized. No aortic stenosis is present.   7. The inferior vena cava is normal in size with greater than 50%  respiratory variability, suggesting right atrial pressure of 3 mmHg.     Labs/Other Tests and Data Reviewed:    EKG:  The EKG is not performed today  Recent Labs: No results found for requested labs within last 365 days.   Recent Lipid Panel No results found for: "CHOL", "TRIG", "HDL", "CHOLHDL", "LDLCALC", "LDLDIRECT"  Wt Readings from Last 3 Encounters:  05/19/22 145 lb 3.2 oz (65.9 kg)  11/13/20 146 lb 12.8 oz (66.6 kg)  05/07/20 145 lb (65.8 kg)     Objective:    Vital Signs:  BP 134/62   Pulse 72   Ht '5\' 9"'  (1.753 m)   Wt 145 lb 3.2 oz (65.9 kg)   SpO2 98%   BMI 21.44 kg/m    General: Well developed, well nourished, NAD  HEENT: OP clear, mucus membranes moist  SKIN: warm,  dry. No rashes. Neuro: No focal deficits  Musculoskeletal: Muscle strength 5/5 all ext  Psychiatric: Mood and affect normal  Neck: No JVD, no carotid bruits, no thyromegaly, no lymphadenopathy.  Lungs:Clear bilaterally, no wheezes, rhonci, crackles Cardiovascular: Regular rate and rhythm. No murmurs, gallops or rubs. Abdomen:Soft. Bowel sounds present. Non-tender.  Extremities: No lower extremity edema. Pulses are 2 + in the bilateral DP/PT.   ASSESSMENT & PLAN:      1. CAD s/p CABG without angina: He had CABG in 2013 and then had stenting of his left  main into the diagonal branch in 2016. This branch was unprotected by the grafts. He has no chest pain suggestive of angina. Will continue Plavix, beta blocker and statin.     2. Ischemic cardiomyopathy: LV function normal by echo in July 2021. He did not tolerate Losartan due to a rash and has been off of Lisinopril due to renal insufficiency.  Continue beta blocker   3. Chronic systolic CHF:  Weight is stable. Continue Lasix 40 mg daily and he will use extra as needed for weight gain or LE edema.   4. PAD: He has known PAD by non-invasive studies at [facility] in 2014 with ABI 0.76 on the left and 0.46 on the right. We have spoken in the past about repeating ABI testing here but he has wished to delay. He will let us know if he has pain in his legs.     5. Atrial fibrillation/flutter: He is s/p ablation. He has not been on long term anticoagulation since he has has no recurrence of his AF following ablation. No palpitations. Continue beta blocker.    6. Former tobacco abuse: He stopped smoking in 1998.    7. Mitral regurgitation: Mild to moderate by echo July 2021 here and unchanged by echo in [place] in July 2023.     8. HTN: BP has been well controlled at home. BP 638-685 systolic at home. He has white coat HTN. Marland Kitchen No changes today  Medication Adjustments/Labs and Tests Ordered: Current medicines are reviewed at length with the  patient today.  Concerns regarding medicines are outlined above.   Tests Ordered: No orders of the defined types were placed in this encounter.    Medication Changes: No orders of the defined types were placed in this encounter.    Disposition:  Follow up with me in one year.   Signed, Lauree Chandler, MD  05/20/2022 12:27 PM    [facility]

## 2022-05-19 NOTE — Patient Instructions (Signed)
Medication Instructions:  Your physician recommends that you continue on your current medications as directed. Please refer to the Current Medication list given to you today.  *If you need a refill on your cardiac medications before your next appointment, please call your pharmacy*   Lab Work: none If you have labs (blood work) drawn today and your tests are completely normal, you will receive your results only by: [place] (if you have MyChart) OR A paper copy in the mail If you have any lab test that is abnormal or we need to change your treatment, we will call you to review the results.   Testing/Procedures: none   Follow-Up: At [facility], you and your health needs are our priority.  As part of our continuing mission to provide you with exceptional heart care, we have created designated Provider Care Teams.  These Care Teams include your primary Cardiologist (physician) and Advanced Practice Providers (APPs -  Physician Assistants and Nurse Practitioners) who all work together to provide you with the care you need, when you need it.  We recommend signing up for the patient portal called "MyChart".  Sign up information is provided on this After Visit Summary.  MyChart is used to connect with patients for Virtual Visits (Telemedicine).  Patients are able to view lab/test results, encounter notes, upcoming appointments, etc.  Non-urgent messages can be sent to your provider as well.   To learn more about what you can do with MyChart, go to [email or web address].    Your next appointment:   12 month(s)  The format for your next appointment:   In Person  Provider:   Lauree Chandler, MD     Other Instructions    Important Information About Sugar

## 2022-07-02 DIAGNOSIS — H40001 Preglaucoma, unspecified, right eye: Secondary | ICD-10-CM | POA: Diagnosis not present

## 2022-07-02 DIAGNOSIS — H35361 Drusen (degenerative) of macula, right eye: Secondary | ICD-10-CM | POA: Diagnosis not present

## 2022-07-02 DIAGNOSIS — H52223 Regular astigmatism, bilateral: Secondary | ICD-10-CM | POA: Diagnosis not present

## 2022-07-02 DIAGNOSIS — H17813 Minor opacity of cornea, bilateral: Secondary | ICD-10-CM | POA: Diagnosis not present

## 2022-07-09 DIAGNOSIS — E785 Hyperlipidemia, unspecified: Secondary | ICD-10-CM | POA: Diagnosis not present

## 2022-07-09 DIAGNOSIS — I129 Hypertensive chronic kidney disease with stage 1 through stage 4 chronic kidney disease, or unspecified chronic kidney disease: Secondary | ICD-10-CM | POA: Diagnosis not present

## 2022-07-09 DIAGNOSIS — N1831 Chronic kidney disease, stage 3a: Secondary | ICD-10-CM | POA: Diagnosis not present

## 2022-07-16 DIAGNOSIS — I255 Ischemic cardiomyopathy: Secondary | ICD-10-CM | POA: Diagnosis not present

## 2022-07-16 DIAGNOSIS — E559 Vitamin D deficiency, unspecified: Secondary | ICD-10-CM | POA: Diagnosis not present

## 2022-07-16 DIAGNOSIS — I5020 Unspecified systolic (congestive) heart failure: Secondary | ICD-10-CM | POA: Diagnosis not present

## 2022-07-16 DIAGNOSIS — E785 Hyperlipidemia, unspecified: Secondary | ICD-10-CM | POA: Diagnosis not present

## 2022-07-16 DIAGNOSIS — I739 Peripheral vascular disease, unspecified: Secondary | ICD-10-CM | POA: Diagnosis not present

## 2022-07-16 DIAGNOSIS — Z0000 Encounter for general adult medical examination without abnormal findings: Secondary | ICD-10-CM | POA: Diagnosis not present

## 2022-07-16 DIAGNOSIS — I700 Atherosclerosis of aorta: Secondary | ICD-10-CM | POA: Diagnosis not present

## 2022-07-16 DIAGNOSIS — N1831 Chronic kidney disease, stage 3a: Secondary | ICD-10-CM | POA: Diagnosis not present

## 2022-07-16 DIAGNOSIS — I129 Hypertensive chronic kidney disease with stage 1 through stage 4 chronic kidney disease, or unspecified chronic kidney disease: Secondary | ICD-10-CM | POA: Diagnosis not present

## 2022-07-16 DIAGNOSIS — I2510 Atherosclerotic heart disease of native coronary artery without angina pectoris: Secondary | ICD-10-CM | POA: Diagnosis not present

## 2022-07-16 DIAGNOSIS — N400 Enlarged prostate without lower urinary tract symptoms: Secondary | ICD-10-CM | POA: Diagnosis not present

## 2022-07-31 DIAGNOSIS — Z23 Encounter for immunization: Secondary | ICD-10-CM | POA: Diagnosis not present

## 2022-12-10 DIAGNOSIS — Z961 Presence of intraocular lens: Secondary | ICD-10-CM | POA: Diagnosis not present

## 2022-12-10 DIAGNOSIS — H1131 Conjunctival hemorrhage, right eye: Secondary | ICD-10-CM | POA: Diagnosis not present

## 2022-12-10 DIAGNOSIS — H0102A Squamous blepharitis right eye, upper and lower eyelids: Secondary | ICD-10-CM | POA: Diagnosis not present

## 2022-12-10 DIAGNOSIS — H17813 Minor opacity of cornea, bilateral: Secondary | ICD-10-CM | POA: Diagnosis not present

## 2023-01-14 DIAGNOSIS — I739 Peripheral vascular disease, unspecified: Secondary | ICD-10-CM | POA: Diagnosis not present

## 2023-01-14 DIAGNOSIS — I255 Ischemic cardiomyopathy: Secondary | ICD-10-CM | POA: Diagnosis not present

## 2023-01-14 DIAGNOSIS — I2510 Atherosclerotic heart disease of native coronary artery without angina pectoris: Secondary | ICD-10-CM | POA: Diagnosis not present

## 2023-01-14 DIAGNOSIS — I700 Atherosclerosis of aorta: Secondary | ICD-10-CM | POA: Diagnosis not present

## 2023-01-14 DIAGNOSIS — N400 Enlarged prostate without lower urinary tract symptoms: Secondary | ICD-10-CM | POA: Diagnosis not present

## 2023-01-14 DIAGNOSIS — I129 Hypertensive chronic kidney disease with stage 1 through stage 4 chronic kidney disease, or unspecified chronic kidney disease: Secondary | ICD-10-CM | POA: Diagnosis not present

## 2023-01-14 DIAGNOSIS — N1831 Chronic kidney disease, stage 3a: Secondary | ICD-10-CM | POA: Diagnosis not present

## 2023-01-14 DIAGNOSIS — I5020 Unspecified systolic (congestive) heart failure: Secondary | ICD-10-CM | POA: Diagnosis not present

## 2023-01-14 DIAGNOSIS — E785 Hyperlipidemia, unspecified: Secondary | ICD-10-CM | POA: Diagnosis not present

## 2023-01-14 DIAGNOSIS — J449 Chronic obstructive pulmonary disease, unspecified: Secondary | ICD-10-CM | POA: Diagnosis not present

## 2023-01-15 ENCOUNTER — Emergency Department (HOSPITAL_BASED_OUTPATIENT_CLINIC_OR_DEPARTMENT_OTHER)
Admission: EM | Admit: 2023-01-15 | Discharge: 2023-01-15 | Disposition: A | Discharge status: Home / Self Care | Payer: Medicare Other | Attending: Emergency Medicine | Admitting: Emergency Medicine

## 2023-01-15 ENCOUNTER — Emergency Department (HOSPITAL_BASED_OUTPATIENT_CLINIC_OR_DEPARTMENT_OTHER): Payer: Medicare Other

## 2023-01-15 ENCOUNTER — Encounter (HOSPITAL_BASED_OUTPATIENT_CLINIC_OR_DEPARTMENT_OTHER): Payer: Self-pay | Admitting: Urology

## 2023-01-15 ENCOUNTER — Telehealth: Payer: Self-pay | Admitting: Cardiovascular Disease

## 2023-01-15 DIAGNOSIS — M79661 Pain in right lower leg: Secondary | ICD-10-CM | POA: Diagnosis not present

## 2023-01-15 DIAGNOSIS — Z7902 Long term (current) use of antithrombotics/antiplatelets: Secondary | ICD-10-CM | POA: Insufficient documentation

## 2023-01-15 DIAGNOSIS — I10 Essential (primary) hypertension: Secondary | ICD-10-CM | POA: Insufficient documentation

## 2023-01-15 DIAGNOSIS — Z79899 Other long term (current) drug therapy: Secondary | ICD-10-CM | POA: Insufficient documentation

## 2023-01-15 DIAGNOSIS — M79604 Pain in right leg: Secondary | ICD-10-CM | POA: Diagnosis not present

## 2023-01-15 NOTE — Telephone Encounter (Signed)
Returned call to patient.  Patient states about 1 week ago he began having cramps in his right calf when walking or climbing stairs. His feet are also bothering him, he said they are not numb or tingling but feel "kind of funny."   Patient also reports he has been having shortness of breath with activity (when making bed, walking). He reports he weighs himself daily and weights have been stable.  Denies any swelling, redness, warmth, or discoloration to LE. Denies any missed doses of diuretic.  Patient has hx of PAD and was advised at last OV to call if he has any pain or concerns regarding this.  Will forward to Dr. Clifton James and his nurse to review and advise.

## 2023-01-15 NOTE — Telephone Encounter (Signed)
Patient is calling stating that he is having symptoms for his PAD. Patient is having cramps in his calf and his feet are hurting. Patient stated they are a little out of breathe. Patient stated the symptoms  started about a week ago.

## 2023-01-15 NOTE — ED Triage Notes (Signed)
Pt states right calf pain x 2 days and bilateral feet tingling  States pain only with walking  No known injury

## 2023-01-15 NOTE — Discharge Instructions (Addendum)
Ultrasound of his your DVT study was negative.  Please follow up with your primary care as needed.

## 2023-01-15 NOTE — Telephone Encounter (Signed)
Returned call to patient.  Based on his reported symptoms with added SOB I asked him to go tot he ER for evaluation to rule out DVT/PE.  He will go to [facility] today.    He said Dr. Clifton James would refer him to vascular surgery when/if needed.

## 2023-01-15 NOTE — ED Provider Notes (Signed)
EMERGENCY DEPARTMENT AT [facility] Provider Note   CSN: [id number] Arrival date & time: 01/15/23  1255     History HTN Chief Complaint  Patient presents with   Calf Pain     Dean Bean is a 89 y.o. male.  89 y.o male with a PMH of HTN, high cholesterol presents to the ED with a chief complaint of right calf pain that is been ongoing for the past 2 weeks.  Patient reports he feels a throbbing, tingling sensation to his bilateral feet, however his right calf feels somewhat full whenever he ambulates.He reports he walking extensively with family, reports sometimes the pain is worsened after his walks.  He will tries to remain active, he has not taken any medication for improvement in symptoms.  He did not suffer any falls, no fever, no prior history of blood clots.  The history is provided by the patient.       Home Medications Prior to Admission medications   Medication Sig Start Date End Date Taking? Authorizing Provider  carvedilol (COREG) 6.25 MG tablet Take 6.25 mg by mouth 2 (two) times daily. 07/20/16   [provider]  clopidogrel (PLAVIX) 75 MG tablet Take 75 mg by mouth daily. 07/15/16   [provider]  Dextromethorphan-guaiFENesin (CORICIDIN HBP CONGESTION/COUGH PO) Take 1 tablet by mouth as needed.  Patient not taking: Reported on 05/19/2022    [provider]  furosemide (LASIX) 40 MG tablet Take 40 mg by mouth daily.  02/12/20   [provider]  simvastatin (ZOCOR) 20 MG tablet Take 20 mg by mouth daily. 07/20/16   [provider]  tamsulosin (FLOMAX) 0.4 MG CAPS capsule  04/23/20   [provider]      Allergies    Losartan potassium    Review of Systems   Review of Systems  Constitutional:  Negative for chills and fever.  Cardiovascular:  Negative for leg swelling.  Musculoskeletal:  Positive for myalgias.    Physical Exam Updated Vital Signs BP (!) 177/75 (BP Location: Left Arm)    Pulse 69   Temp 98.2 F (36.8 C) (Oral)   Resp 16   Ht 5\' 9"  (1.753 m)   Wt 66.7 kg   SpO2 96%   BMI 21.71 kg/m  Physical Exam Vitals and nursing note reviewed.  Constitutional:      Appearance: Normal appearance.  HENT:     Head: Normocephalic and atraumatic.  Eyes:     Pupils: Pupils are equal, round, and reactive to light.  Cardiovascular:     Rate and Rhythm: Normal rate.     Pulses:          Dorsalis pedis pulses are detected w/ Doppler on the right side and detected w/ Doppler on the left side.       Posterior tibial pulses are detected w/ Doppler on the right side and detected w/ Doppler on the left side.  Pulmonary:     Effort: Pulmonary effort is normal.  Abdominal:     General: Abdomen is flat.  Musculoskeletal:     Cervical back: Normal range of motion and neck supple.       Legs:  Skin:    General: Skin is warm and dry.  Neurological:     Mental Status: He is alert and oriented to person, place, and time.     ED Results / Procedures / Treatments   Labs (all labs ordered are listed, but only abnormal results are  displayed) Labs Reviewed - No data to display  EKG None  Radiology US Venous Img Lower Unilateral Right  Result Date: 01/15/2023 CLINICAL DATA:  right calf pain EXAM: RIGHT LOWER EXTREMITY VENOUS DOPPLER ULTRASOUND TECHNIQUE: Gray-scale sonography with graded compression, as well as color Doppler and duplex ultrasound were performed to evaluate the lower extremity deep venous systems from the level of the common femoral vein and including the common femoral, femoral, profunda femoral, popliteal and calf veins including the posterior tibial, peroneal and gastrocnemius veins when visible. The superficial great saphenous vein was also interrogated. Spectral Doppler was utilized to evaluate flow at rest and with distal augmentation maneuvers in the common femoral, femoral and popliteal veins. COMPARISON:  None Available. FINDINGS: Contralateral Common  Femoral Vein: Respiratory phasicity is normal and symmetric with the symptomatic side. No evidence of thrombus. Normal compressibility. Common Femoral Vein: No evidence of thrombus. Normal compressibility, respiratory phasicity and response to augmentation. Saphenofemoral Junction: No evidence of thrombus. Normal compressibility and flow on color Doppler imaging. Profunda Femoral Vein: No evidence of thrombus. Normal compressibility and flow on color Doppler imaging. Femoral Vein: No evidence of thrombus. Normal compressibility, respiratory phasicity and response to augmentation. Popliteal Vein: No evidence of thrombus. Normal compressibility, respiratory phasicity and response to augmentation. Calf Veins: Calf veins were not visualized on grayscale imaging, but were without evidence of occlusive thrombus on color Doppler evaluation. Superficial Great Saphenous Vein: No evidence of thrombus. Normal compressibility. Venous Reflux:  None. Other Findings: Severe atherosclerotic disease with diminished flow in the midportion of the right femoral artery. IMPRESSION: 1. Calf veins were poorly visualized and could not be accurately assessed for the presence of nonocclusive thrombus. Within this limitation, no evidence of deep venous thrombosis. 2. Severe atherosclerotic disease with diminished flow in the mid segment of the right femoral artery. Electronically Signed   By: Lorenza Cambridge M.D.   On: 01/15/2023 14:13    Procedures Procedures    Medications Ordered in ED Medications - No data to display  ED Course/ Medical Decision Making/ A&P                             Medical Decision Making   Patient here with a chief complaint of right calf pain for approximately 2 weeks, reports exacerbated after any ambulation, he does try to remain active for the most part with walking daily. Doppler pulses BL, with intact sensation and good motor function. He does feel like it stretches and this makes it worse.  He is  currently on Plavix.  No prior history of blood clot, arrived to the ED hemodynamically stable.  Not endorsing any chest pain or shortness of breath.  Ultrasound is negative, there is some  1. Calf veins were poorly visualized and could not be accurately  assessed for the presence of nonocclusive thrombus. Within this  limitation, no evidence of deep venous thrombosis.  2. Severe atherosclerotic disease with diminished flow in the mid  segment of the right femoral artery.   We discussed likely muscle strain, will do RICE therapy, elevation, follow-up with primary care as needed.  Patient is hemodynamically stable stable for discharge.  Portions of this note were generated with Scientist, clinical (histocompatibility and immunogenetics). Dictation errors may occur despite best attempts at proofreading.   Final Clinical Impression(s) / ED Diagnoses Final diagnoses:  Right calf pain    Rx / DC Orders ED Discharge Orders     None  Claude Manges, PA-C 01/15/23 1435    Virgina Norfolk, DO 01/15/23 1439

## 2023-02-01 DIAGNOSIS — H40001 Preglaucoma, unspecified, right eye: Secondary | ICD-10-CM | POA: Diagnosis not present

## 2023-02-01 DIAGNOSIS — H04123 Dry eye syndrome of bilateral lacrimal glands: Secondary | ICD-10-CM | POA: Diagnosis not present

## 2023-02-01 DIAGNOSIS — Z961 Presence of intraocular lens: Secondary | ICD-10-CM | POA: Diagnosis not present

## 2023-04-20 DIAGNOSIS — U071 COVID-19: Secondary | ICD-10-CM | POA: Diagnosis not present

## 2023-04-20 DIAGNOSIS — J029 Acute pharyngitis, unspecified: Secondary | ICD-10-CM | POA: Diagnosis not present

## 2023-06-01 NOTE — Progress Notes (Unsigned)
Cardiology Office Note:   Date:  06/02/2023  ID:  Dean Bean, DOB August 19, 1931, MRN [id number] PCP: Laurann Montana, MD  [facility] Providers Cardiologist:  Verne Carrow, MD    History of Present Illness:   Dean Bean is a 89 y.o. male with history of CKD, CAD s/p CABG x4 2013 (LIMA to LAD, SVG to OM, SVG to PDA, SVG to Diagonal), stenting of the left main artery in 2016, atrial flutter/fib s/p ablation, ischemic cardiomyopathy, chronic systolic CHF, PAD, COPD.   Discussed the use of AI scribe software for clinical note transcription with the patient, who gave verbal consent to proceed.     Patient reports overall good health with no recent chest pain, worsening shortness of breath, dizziness, or palpitations. He maintains an active lifestyle, navigating stairs multiple times a day, doing his own laundry, and walking from the furthest parking spot when going to the store. He denies any exacerbation of symptoms related to his peripheral artery disease, with no recent numbness, tingling, or sharp pain in the legs.  The patient had a COVID-19 infection about a month ago, which was mild and localized to the throat, with no cough. He has since recovered well. He also had an emergency department visit in May due to leg pain and dyspnea, which he attributes to an anxiety attack. He underwent vascular US testing which noted evere atherosclerotic disease with diminished flow in mid segment of right femoral artery.   He reports occasional shortness of breath, particularly when making the bed or walking rapidly up the stairs. He also experiences hip pain when walking, but feels this is orthopedic. He has a daily glass of red wine with his meal and maintains a diet primarily consisting of meat, potatoes, and vegetables.  The patient is on a regimen of carvedilol 6.25 mg twice daily, Plavix, Lasix 40 mg daily, and simvastatin 20 mg. He denies any fluid accumulation in the legs. His  last cholesterol check in October 2023 showed an LDL of 77. He is due for another cholesterol check soon with his PCP.  The patient had an ablation in 2013 for atrial fibrillation and has not had any recurrence of symptoms since then. His last echocardiogram in July 2021 showed mild to moderate mitral regurgitation. He has not had any significant changes in weight. His blood pressure at the last visit was 120/80.      Today patient denies chest pain, lower extremity edema, fatigue, palpitations, melena, hematuria, hemoptysis, diaphoresis, weakness, presyncope, syncope, orthopnea, and PND.   Studies Reviewed:    EKG:   EKG Interpretation Date/Time:  Wednesday June 02 2023 14:59:38 EDT Ventricular Rate:  66 PR Interval:  254 QRS Duration:  150 QT Interval:  426 QTC Calculation: 446 R Axis:   63  Text Interpretation: Sinus rhythm with 1st degree A-V block Right bundle branch block Confirmed by Perlie Gold [phone]) on 06/02/2023 3:52:22 PM    Results LABS LDL: 77 mg/dL (29/5284) Creatinine: 1.32 (06/2022)  RADIOLOGY Venous duplex: Severe atherosclerotic disease with diminished flow in the mid segment of the right femoral artery (01/2023)  DIAGNOSTIC Echocardiogram: Mild to moderate mitral regurgitation (03/2020) Echocardiogram: Unchanged mitral regurgitation (03/2022) Echocardiogram: Ejection fraction 55% (03/2022)  Risk Assessment/Calculations:    CHA2DS2-VASc Score = 5   This indicates a 7.2% annual risk of stroke. The patient's score is based upon: CHF History: 1 HTN History: 1 Diabetes History: 0 Stroke History: 0 Vascular Disease History: 1 Age Score: 2 Gender Score: 0  Physical Exam:   VS:  BP 120/80   Pulse 66   Ht 5\' 9"  (1.753 m)   Wt 152 lb 3.2 oz (69 kg)   SpO2 97%   BMI 22.48 kg/m    Wt Readings from Last 3 Encounters:  06/02/23 152 lb 3.2 oz (69 kg)  01/15/23 147 lb (66.7 kg)  05/19/22 145 lb 3.2 oz (65.9 kg)     Physical  Exam Vitals reviewed.  Constitutional:      Appearance: Normal appearance.  HENT:     Head: Normocephalic.  Eyes:     Pupils: Pupils are equal, round, and reactive to light.  Cardiovascular:     Rate and Rhythm: Normal rate and regular rhythm.     Pulses: Decreased pulses (Right posterior tibial pulse faint in comparision to left).     Heart sounds: Murmur (systolic, 2/6 at apex) heard.  Pulmonary:     Effort: Pulmonary effort is normal.     Breath sounds: Normal breath sounds.  Abdominal:     General: Abdomen is flat.     Palpations: Abdomen is soft.  Musculoskeletal:     Right lower leg: No edema.     Left lower leg: No edema.  Skin:    General: Skin is warm and dry.     Capillary Refill: Capillary refill takes less than 2 seconds.  Neurological:     General: No focal deficit present.     Mental Status: He is alert and oriented to person, place, and time.  Psychiatric:        Mood and Affect: Mood normal.        Behavior: Behavior normal.        Thought Content: Thought content normal.        Judgment: Judgment normal.      ASSESSMENT AND PLAN:    Assessment and Plan    Coronary Artery Disease s/p CABG Hyperlipidemia Stable, no recent chest pain or shortness of breath. LDL was 77 in October 2023, which is above the recommended target of <55 for patients with history of CABG and CKD. -Await results of upcoming lipid panel from primary care provider to determine statin adjustment. For now continue Simvastatin 20mg . Would consider switch to higher intensity statin, possible Atorvastatin given CKD. -Continue Plavix  Heart Failure with Preserved Ejection Fraction Stable, no recent exacerbations or fluid overload symptoms. Ejection fraction was 55% on most recent echocardiogram. -Continue Lasix 40mg  daily. BMP with PCP soon. -Consider future discussion of Jardiance or Farxiga for renal protection, given CKD.  Peripheral Artery Disease No worsening of symptoms. Severe  atherosclerotic disease with diminished flow in the mid segment of the right femoral artery noted on May 2024 venous duplex. Correlates with weak PT pulses right leg. -Continue current management and monitor for progression of symptoms. Discussed repeat ABI but patient wishes to defer this for now.  Atrial Fibrillation/Flutter No recurrence of symptoms since ablation in 2013. -Continue current management.  Mitral Regurgitation Mild to moderate in 2021, unchanged on echocardiogram in July 2023. -Order repeat echocardiogram for annual monitoring.  Hypertension BP well controlled.  -Continue Carvedilol 6.25mg  BID  -Follow up in one year unless new symptoms or concerns arise.               Signed, Perlie Gold, PA-C

## 2023-06-02 ENCOUNTER — Encounter: Payer: Self-pay | Admitting: Cardiology

## 2023-06-02 ENCOUNTER — Ambulatory Visit: Payer: Medicare Other | Attending: Cardiology | Admitting: Cardiology

## 2023-06-02 VITALS — BP 120/80 | HR 66 | Ht 69.0 in | Wt 152.2 lb

## 2023-06-02 DIAGNOSIS — I340 Nonrheumatic mitral (valve) insufficiency: Secondary | ICD-10-CM | POA: Diagnosis present

## 2023-06-02 DIAGNOSIS — I25810 Atherosclerosis of coronary artery bypass graft(s) without angina pectoris: Secondary | ICD-10-CM | POA: Insufficient documentation

## 2023-06-02 DIAGNOSIS — I5022 Chronic systolic (congestive) heart failure: Secondary | ICD-10-CM | POA: Diagnosis present

## 2023-06-02 DIAGNOSIS — I480 Paroxysmal atrial fibrillation: Secondary | ICD-10-CM | POA: Diagnosis not present

## 2023-06-02 DIAGNOSIS — I739 Peripheral vascular disease, unspecified: Secondary | ICD-10-CM | POA: Diagnosis not present

## 2023-06-02 DIAGNOSIS — I10 Essential (primary) hypertension: Secondary | ICD-10-CM | POA: Insufficient documentation

## 2023-06-02 DIAGNOSIS — R0602 Shortness of breath: Secondary | ICD-10-CM | POA: Insufficient documentation

## 2023-06-02 NOTE — Patient Instructions (Signed)
Medication Instructions:  Your physician recommends that you continue on your current medications as directed. Please refer to the Current Medication list given to you today.  *If you need a refill on your cardiac medications before your next appointment, please call your pharmacy*   Lab Work: None ordered  If you have labs (blood work) drawn today and your tests are completely normal, you will receive your results only by: MyChart Message (if you have MyChart) OR A paper copy in the mail If you have any lab test that is abnormal or we need to change your treatment, we will call you to review the results.   Testing/Procedures: Your physician has requested that you have an echocardiogram. Echocardiography is a painless test that uses sound waves to create images of your heart. It provides your doctor with information about the size and shape of your heart and how well your heart's chambers and valves are working. This procedure takes approximately one hour. There are no restrictions for this procedure. Please do NOT wear cologne, perfume, aftershave, or lotions (deodorant is allowed). Please arrive 15 minutes prior to your appointment time.    Follow-Up: At [facility], you and your health needs are our priority.  As part of our continuing mission to provide you with exceptional heart care, we have created designated Provider Care Teams.  These Care Teams include your primary Cardiologist (physician) and Advanced Practice Providers (APPs -  Physician Assistants and Nurse Practitioners) who all work together to provide you with the care you need, when you need it.  We recommend signing up for the patient portal called "MyChart".  Sign up information is provided on this After Visit Summary.  MyChart is used to connect with patients for Virtual Visits (Telemedicine).  Patients are able to view lab/test results, encounter notes, upcoming appointments, etc.  Non-urgent messages can be  sent to your provider as well.   To learn more about what you can do with MyChart, go to [email or web address].    Your next appointment:   1 year(s)  Provider:   Verne Carrow, MD     Other Instructions

## 2023-06-23 ENCOUNTER — Ambulatory Visit (HOSPITAL_COMMUNITY): Payer: Medicare Other | Attending: Cardiovascular Disease

## 2023-06-23 DIAGNOSIS — I340 Nonrheumatic mitral (valve) insufficiency: Secondary | ICD-10-CM

## 2023-06-23 LAB — ECHOCARDIOGRAM COMPLETE
Area-P 1/2: 3.81 cm2
S' Lateral: 3.6 cm

## 2023-07-28 ENCOUNTER — Telehealth: Payer: Self-pay | Admitting: Cardiovascular Disease

## 2023-07-28 NOTE — Telephone Encounter (Signed)
Pt is trying to find out his results from 9/18. Please advise

## 2023-07-28 NOTE — Telephone Encounter (Signed)
Spoke with patient and he is aware of Echo results. He verbalized understanding

## 2023-08-04 DIAGNOSIS — H40001 Preglaucoma, unspecified, right eye: Secondary | ICD-10-CM | POA: Diagnosis not present

## 2023-08-04 DIAGNOSIS — Z961 Presence of intraocular lens: Secondary | ICD-10-CM | POA: Diagnosis not present

## 2023-08-16 DIAGNOSIS — I129 Hypertensive chronic kidney disease with stage 1 through stage 4 chronic kidney disease, or unspecified chronic kidney disease: Secondary | ICD-10-CM | POA: Diagnosis not present

## 2023-08-16 DIAGNOSIS — R911 Solitary pulmonary nodule: Secondary | ICD-10-CM | POA: Diagnosis not present

## 2023-08-16 DIAGNOSIS — E785 Hyperlipidemia, unspecified: Secondary | ICD-10-CM | POA: Diagnosis not present

## 2023-08-16 DIAGNOSIS — I5020 Unspecified systolic (congestive) heart failure: Secondary | ICD-10-CM | POA: Diagnosis not present

## 2023-08-16 DIAGNOSIS — I739 Peripheral vascular disease, unspecified: Secondary | ICD-10-CM | POA: Diagnosis not present

## 2023-08-16 DIAGNOSIS — I255 Ischemic cardiomyopathy: Secondary | ICD-10-CM | POA: Diagnosis not present

## 2023-08-16 DIAGNOSIS — N1831 Chronic kidney disease, stage 3a: Secondary | ICD-10-CM | POA: Diagnosis not present

## 2023-08-16 DIAGNOSIS — Z0000 Encounter for general adult medical examination without abnormal findings: Secondary | ICD-10-CM | POA: Diagnosis not present

## 2023-08-16 DIAGNOSIS — I700 Atherosclerosis of aorta: Secondary | ICD-10-CM | POA: Diagnosis not present

## 2023-08-16 DIAGNOSIS — N400 Enlarged prostate without lower urinary tract symptoms: Secondary | ICD-10-CM | POA: Diagnosis not present

## 2023-08-16 DIAGNOSIS — J449 Chronic obstructive pulmonary disease, unspecified: Secondary | ICD-10-CM | POA: Diagnosis not present

## 2023-08-16 DIAGNOSIS — I2510 Atherosclerotic heart disease of native coronary artery without angina pectoris: Secondary | ICD-10-CM | POA: Diagnosis not present

## 2023-08-16 DIAGNOSIS — E559 Vitamin D deficiency, unspecified: Secondary | ICD-10-CM | POA: Diagnosis not present

## 2023-08-17 LAB — COMPREHENSIVE METABOLIC PANEL: EGFR: 44

## 2023-08-18 DIAGNOSIS — M79672 Pain in left foot: Secondary | ICD-10-CM | POA: Diagnosis not present

## 2023-08-20 ENCOUNTER — Telehealth: Payer: Self-pay | Admitting: *Deleted

## 2023-08-20 NOTE — Telephone Encounter (Signed)
Patient called [place] back.  He states that he was NOT fasting for the lipid panel.  Per pt, the concern was an elevated WBC and questionable infection of toe; PCP diagnosed him with Gout and prednisone was prescribed.  The plan is to repeat CBC and Lipids (this time fasting) at his PCP sometime during the latter part of week of 12/9.  He will have PCP's office send [place] the results again and then he would like [place] to re evaluate the need to switch to Atorvastatin 40mg .

## 2023-08-23 DIAGNOSIS — N1831 Chronic kidney disease, stage 3a: Secondary | ICD-10-CM | POA: Diagnosis not present

## 2023-08-23 DIAGNOSIS — D72829 Elevated white blood cell count, unspecified: Secondary | ICD-10-CM | POA: Diagnosis not present

## 2023-08-23 DIAGNOSIS — M79675 Pain in left toe(s): Secondary | ICD-10-CM | POA: Diagnosis not present

## 2023-10-01 DIAGNOSIS — D72829 Elevated white blood cell count, unspecified: Secondary | ICD-10-CM | POA: Diagnosis not present

## 2023-12-27 DIAGNOSIS — I2510 Atherosclerotic heart disease of native coronary artery without angina pectoris: Secondary | ICD-10-CM | POA: Diagnosis not present

## 2023-12-27 DIAGNOSIS — I5020 Unspecified systolic (congestive) heart failure: Secondary | ICD-10-CM | POA: Diagnosis not present

## 2023-12-27 DIAGNOSIS — N1831 Chronic kidney disease, stage 3a: Secondary | ICD-10-CM | POA: Diagnosis not present

## 2023-12-27 DIAGNOSIS — I739 Peripheral vascular disease, unspecified: Secondary | ICD-10-CM | POA: Diagnosis not present

## 2024-01-12 DIAGNOSIS — I255 Ischemic cardiomyopathy: Secondary | ICD-10-CM | POA: Diagnosis not present

## 2024-01-12 DIAGNOSIS — I739 Peripheral vascular disease, unspecified: Secondary | ICD-10-CM | POA: Diagnosis not present

## 2024-01-12 DIAGNOSIS — Z85038 Personal history of other malignant neoplasm of large intestine: Secondary | ICD-10-CM | POA: Diagnosis not present

## 2024-01-12 DIAGNOSIS — I2510 Atherosclerotic heart disease of native coronary artery without angina pectoris: Secondary | ICD-10-CM | POA: Diagnosis not present

## 2024-01-12 DIAGNOSIS — I129 Hypertensive chronic kidney disease with stage 1 through stage 4 chronic kidney disease, or unspecified chronic kidney disease: Secondary | ICD-10-CM | POA: Diagnosis not present

## 2024-01-12 DIAGNOSIS — N1831 Chronic kidney disease, stage 3a: Secondary | ICD-10-CM | POA: Diagnosis not present

## 2024-01-12 DIAGNOSIS — I5020 Unspecified systolic (congestive) heart failure: Secondary | ICD-10-CM | POA: Diagnosis not present

## 2024-02-01 DIAGNOSIS — I2510 Atherosclerotic heart disease of native coronary artery without angina pectoris: Secondary | ICD-10-CM | POA: Diagnosis not present

## 2024-02-01 DIAGNOSIS — I739 Peripheral vascular disease, unspecified: Secondary | ICD-10-CM | POA: Diagnosis not present

## 2024-02-01 DIAGNOSIS — I5020 Unspecified systolic (congestive) heart failure: Secondary | ICD-10-CM | POA: Diagnosis not present

## 2024-02-01 DIAGNOSIS — N1831 Chronic kidney disease, stage 3a: Secondary | ICD-10-CM | POA: Diagnosis not present

## 2024-02-02 DIAGNOSIS — Z961 Presence of intraocular lens: Secondary | ICD-10-CM | POA: Diagnosis not present

## 2024-02-02 DIAGNOSIS — H40001 Preglaucoma, unspecified, right eye: Secondary | ICD-10-CM | POA: Diagnosis not present

## 2024-02-02 NOTE — Progress Notes (Signed)
 Evaluation Performed:  Follow-up visit  Date:  02/03/2024   ID:  Dean Bean, DOB 1931-03-30, MRN [id number]  Chief Complaint:   Chief Complaint  Patient presents with   Follow-up    Mitral regurgitation    History of Present Illness:    Dean Bean is a 89 y.o. male with history of CKD, CAD s/p 4V CABG in 2013, atrial flutter/fib s/p ablation 2013, ischemic cardiomyopathy, chronic systolic CHF and COPD who is here today for cardiac follow up. I met him as a new patient to establish cardiology care on 10/02/16. He had been followed in [facility] by Dr. Garnett Justice but moved to [facility] at the end of 2017. Cardiac history includes 4V CABG in November 2013 (LIMA to LAD, SVG to OM, SVG to PDA, SVG to Diagonal) and stenting of the left main artery in 2016 (DES placed from left main into the diagonal not protected by LIMA graft). He had an atrial flutter ablation in 2013. He had an echo in 2016 with normal LVEF and mild MR. He is also known to have PAD. He was told he had blockages in his legs and was seen by vascular surgery but he says they said there was nothing to worry about. Former smoker (50 pack years, stopped smoking in 1998). He was admitted to [facility] April 2018 with dyspnea and was felt to be volume overloaded. He was diuresed with IV Lasix  and had rapid improvement in his dyspnea. Echo 12/21/16 with LVEF=40-45% with mild MR. He was admitted to [facility] 09/08/19 with dyspnea, hypertensive emergency. He was diuresed for mild volume overload and started on Imdur . Echo 09/09/19 with LVEF=45-50%, unchanged. Mild MR. He was in [place] following his wife's death and had a rectal mass identified. This has since been removed. He had his colostomy reversed recently. He was admitted with acute CHF in [place] last week in the setting of a hypertensive crisis. He was anemic. He was diuresed with IV Lasix . Echo in [place] with LVEF=50-55%, grade 2 diastolic dysfunction.[name] He was told there that he  needed a MitraClip for severe MR. His echo in December 2020 here and April 2021 in [place] showed mild to moderate MR. Echo July 2021 with LVEF=50-55%. Mild to moderate MR. He was admitted to [facility] July 2021 with volume overload. Losartan  stopped in July 2021 due to rash. He was tried on Lisinopril  but this was held due to worsening renal function. He was admitted in [place]  with CHF in July 2023. Echo there with LVEF=55%, mild to moderate MR. Echo October 2024 with LVEF=50-55%. Mild RV dysfunction. Moderate to severe MR.   He is here today for follow up. The patient denies any chest pain, dyspnea, palpitations, lower extremity edema, orthopnea, PND, dizziness, near syncope or syncope.   Primary Care Physician: Victorio Grave, MD   Past Medical History:  Diagnosis Date   Atrial fibrillation [facility])    CAD (coronary artery disease)    s/p CABG   COPD (chronic obstructive pulmonary disease) (HCC)    HTN (hypertension)    Ischemic cardiomyopathy    Mitral regurgitation    Systolic CHF (HCC)    Past Surgical History:  Procedure Laterality Date   CORONARY ARTERY BYPASS GRAFT     4Vessel     Current Meds  Medication Sig   carvedilol  (COREG ) 6.25 MG tablet Take 6.25 mg by mouth 2 (two) times daily.   Cholecalciferol 50 MCG (2000 UT) CAPS 1 capsule.   clopidogrel  (PLAVIX ) 75 MG  tablet Take 75 mg by mouth daily.   Dextromethorphan-guaiFENesin (CORICIDIN HBP CONGESTION/COUGH PO) Take 1 tablet by mouth as needed.   furosemide  (LASIX ) 40 MG tablet Take 40 mg by mouth daily.    simvastatin  (ZOCOR ) 20 MG tablet Take 20 mg by mouth daily.   tamsulosin (FLOMAX) 0.4 MG CAPS capsule daily.     Allergies:   Losartan  potassium   Social History   Tobacco Use   Smoking status: Former    Current packs/day: 0.00    Average packs/day: 1 pack/day for 50.0 years (50.0 ttl pk-yrs)    Types: Cigarettes    Start date: 10/02/1946    Quit date: 10/02/1996    Years since quitting: 27.3   Smokeless tobacco:  Never  Vaping Use   Vaping status: Never Used  Substance Use Topics   Alcohol use: Yes    Alcohol/week: 7.0 standard drinks of alcohol    Types: 7 Glasses of wine per week    Comment: occ   Drug use: No     Family Hx: The patient's family history includes Cancer in his mother; Heart attack (age of onset: 76) in his father.  ROS:   Please see the history of present illness.    All other systems reviewed and are negative.    Labs/Other Tests and Data Reviewed:    EKG:  The EKG is not performed today  Recent Labs: No results found for requested labs within last 365 days.   Recent Lipid Panel No results found for: "CHOL", "TRIG", "HDL", "CHOLHDL", "LDLCALC", "LDLDIRECT"  Wt Readings from Last 3 Encounters:  02/03/24 153 lb 6.4 oz (69.6 kg)  06/02/23 152 lb 3.2 oz (69 kg)  01/15/23 147 lb (66.7 kg)     Objective:    Vital Signs:  BP (!) 158/68   Pulse 75   Ht 5\' 9"  (1.753 m)   Wt 153 lb 6.4 oz (69.6 kg)   SpO2 99%   BMI 22.65 kg/m    General: Well developed, well nourished, NAD  HEENT: OP clear, mucus membranes moist  SKIN: warm, dry. No rashes. Neuro: No focal deficits  Musculoskeletal: Muscle strength 5/5 all ext  Psychiatric: Mood and affect normal  Neck: No JVD, no carotid bruits, no thyromegaly, no lymphadenopathy.  Lungs:Clear bilaterally, no wheezes, rhonci, crackles Cardiovascular: Regular rate and rhythm. Systolic murmur.  Abdomen:Soft. Bowel sounds present. Non-tender.  Extremities: No lower extremity edema. Pulses are 2 + in the bilateral DP/PT.  ASSESSMENT & PLAN:     1. CAD s/p CABG without angina: He had CABG in 2013 and then had stenting of his left main into the diagonal branch in 2016. This branch was unprotected by the grafts. No chest pain. Continue Plavix , statin and beta blocker.      2. Ischemic cardiomyopathy: LV function normal by echo in October 2024. He did not tolerate Losartan  due to a rash and has been off of Lisinopril  due to renal  insufficiency.  Continue beta blocker.    3. Chronic systolic CHF:  Wt is stable. Continue Lasix  40 mg daily  4. PAD: He has known PAD by non-invasive studies at [facility] in 2014 with ABI 0.76 on the left and 0.46 on the right. We have spoken in the past about repeating ABI testing here but he has wished to delay. He will let us  know if he has pain in his legs.     5. Atrial fibrillation/flutter: He is s/p ablation. He has not been on long  term anticoagulation since he has has no recurrence of his AF following ablation. No palpitations. Continue beta blocker.     6. Former tobacco abuse: He stopped smoking in 1998.    7. Mitral regurgitation: Moderate to severe by echo October 2024. He remains asymptomatic. Continue to follow.  Repeat echo in October 2025.    8. HTN: BP is well controlled at home. No changes today  Medication Adjustments/Labs and Tests Ordered: Current medicines are reviewed at length with the patient today.  Concerns regarding medicines are outlined above.   Tests Ordered: Orders Placed This Encounter  Procedures   ECHOCARDIOGRAM COMPLETE     Medication Changes: No orders of the defined types were placed in this encounter.    Disposition:  Follow up with me in one year.   Signed, Antoinette Batman, MD  02/03/2024 8:24 AM    [facility]

## 2024-02-03 ENCOUNTER — Ambulatory Visit: Payer: Medicare Other | Attending: Cardiovascular Disease | Admitting: Cardiovascular Disease

## 2024-02-03 ENCOUNTER — Encounter: Payer: Self-pay | Admitting: Cardiovascular Disease

## 2024-02-03 VITALS — BP 158/68 | HR 75 | Ht 69.0 in | Wt 153.4 lb

## 2024-02-03 DIAGNOSIS — I10 Essential (primary) hypertension: Secondary | ICD-10-CM | POA: Diagnosis not present

## 2024-02-03 DIAGNOSIS — I5022 Chronic systolic (congestive) heart failure: Secondary | ICD-10-CM | POA: Insufficient documentation

## 2024-02-03 DIAGNOSIS — I25810 Atherosclerosis of coronary artery bypass graft(s) without angina pectoris: Secondary | ICD-10-CM | POA: Insufficient documentation

## 2024-02-03 DIAGNOSIS — I739 Peripheral vascular disease, unspecified: Secondary | ICD-10-CM | POA: Insufficient documentation

## 2024-02-03 DIAGNOSIS — I480 Paroxysmal atrial fibrillation: Secondary | ICD-10-CM | POA: Insufficient documentation

## 2024-02-03 DIAGNOSIS — I340 Nonrheumatic mitral (valve) insufficiency: Secondary | ICD-10-CM | POA: Insufficient documentation

## 2024-02-03 DIAGNOSIS — I255 Ischemic cardiomyopathy: Secondary | ICD-10-CM | POA: Insufficient documentation

## 2024-02-03 NOTE — Patient Instructions (Signed)
 Medication Instructions:  No changes *If you need a refill on your cardiac medications before your next appointment, please call your pharmacy*  Lab Work: none If you have labs (blood work) drawn today and your tests are completely normal, you will receive your results only by: MyChart Message (if you have MyChart) OR A paper copy in the mail If you have any lab test that is abnormal or we need to change your treatment, we will call you to review the results.  Testing/Procedures: ECHO DUE IN OCTOBER 2025 Your physician has requested that you have an echocardiogram. Echocardiography is a painless test that uses sound waves to create images of your heart. It provides your doctor with information about the size and shape of your heart and how well your heart's chambers and valves are working. This procedure takes approximately one hour. There are no restrictions for this procedure. Please do NOT wear cologne, perfume, aftershave, or lotions (deodorant is allowed). Please arrive 15 minutes prior to your appointment time.  Please note: We ask at that you not bring children with you during ultrasound (echo/ vascular) testing. Due to room size and safety concerns, children are not allowed in the ultrasound rooms during exams. Our front office staff cannot provide observation of children in our lobby area while testing is being conducted. An adult accompanying a patient to their appointment will only be allowed in the ultrasound room at the discretion of the ultrasound technician under special circumstances. We apologize for any inconvenience.   Follow-Up: At [facility], you and your health needs are our priority.  As part of our continuing mission to provide you with exceptional heart care, our providers are all part of one team.  This team includes your primary Cardiologist (physician) and Advanced Practice Providers or APPs (Physician Assistants and Nurse Practitioners) who all work  together to provide you with the care you need, when you need it.  Your next appointment:   12 month(s)  Provider:   Antoinette Batman, MD     Other Instructions

## 2024-02-12 DIAGNOSIS — I129 Hypertensive chronic kidney disease with stage 1 through stage 4 chronic kidney disease, or unspecified chronic kidney disease: Secondary | ICD-10-CM | POA: Diagnosis not present

## 2024-02-12 DIAGNOSIS — I255 Ischemic cardiomyopathy: Secondary | ICD-10-CM | POA: Diagnosis not present

## 2024-02-12 DIAGNOSIS — Z85038 Personal history of other malignant neoplasm of large intestine: Secondary | ICD-10-CM | POA: Diagnosis not present

## 2024-02-12 DIAGNOSIS — N1831 Chronic kidney disease, stage 3a: Secondary | ICD-10-CM | POA: Diagnosis not present

## 2024-05-11 DIAGNOSIS — I2510 Atherosclerotic heart disease of native coronary artery without angina pectoris: Secondary | ICD-10-CM | POA: Diagnosis not present

## 2024-05-11 DIAGNOSIS — Z7982 Long term (current) use of aspirin: Secondary | ICD-10-CM | POA: Diagnosis not present

## 2024-05-11 DIAGNOSIS — I509 Heart failure, unspecified: Secondary | ICD-10-CM | POA: Diagnosis not present

## 2024-05-11 DIAGNOSIS — Z87891 Personal history of nicotine dependence: Secondary | ICD-10-CM | POA: Diagnosis not present

## 2024-05-11 DIAGNOSIS — R0602 Shortness of breath: Secondary | ICD-10-CM | POA: Diagnosis not present

## 2024-05-11 DIAGNOSIS — R079 Chest pain, unspecified: Secondary | ICD-10-CM | POA: Diagnosis not present

## 2024-05-11 DIAGNOSIS — Z743 Need for continuous supervision: Secondary | ICD-10-CM | POA: Diagnosis not present

## 2024-05-11 DIAGNOSIS — J449 Chronic obstructive pulmonary disease, unspecified: Secondary | ICD-10-CM | POA: Diagnosis not present

## 2024-05-11 DIAGNOSIS — Z85038 Personal history of other malignant neoplasm of large intestine: Secondary | ICD-10-CM | POA: Diagnosis not present

## 2024-05-11 DIAGNOSIS — Z7902 Long term (current) use of antithrombotics/antiplatelets: Secondary | ICD-10-CM | POA: Diagnosis not present

## 2024-06-27 ENCOUNTER — Other Ambulatory Visit (HOSPITAL_COMMUNITY)

## 2024-07-14 ENCOUNTER — Ambulatory Visit (HOSPITAL_COMMUNITY)
Admission: RE | Admit: 2024-07-14 | Discharge: 2024-07-14 | Disposition: A | Discharge status: Home / Self Care | Source: Ambulatory Visit | Attending: Cardiovascular Disease | Admitting: Cardiovascular Disease

## 2024-07-14 DIAGNOSIS — I340 Nonrheumatic mitral (valve) insufficiency: Secondary | ICD-10-CM | POA: Diagnosis not present

## 2024-07-14 LAB — ECHOCARDIOGRAM COMPLETE
Area-P 1/2: 3.74 cm2
S' Lateral: 3.8 cm

## 2024-07-17 ENCOUNTER — Ambulatory Visit: Payer: Self-pay | Admitting: Cardiovascular Disease

## 2024-08-02 DIAGNOSIS — Z961 Presence of intraocular lens: Secondary | ICD-10-CM | POA: Diagnosis not present

## 2024-08-02 DIAGNOSIS — H40001 Preglaucoma, unspecified, right eye: Secondary | ICD-10-CM | POA: Diagnosis not present

## 2024-08-02 DIAGNOSIS — H04123 Dry eye syndrome of bilateral lacrimal glands: Secondary | ICD-10-CM | POA: Diagnosis not present

## 2024-08-16 DIAGNOSIS — J449 Chronic obstructive pulmonary disease, unspecified: Secondary | ICD-10-CM | POA: Diagnosis not present

## 2024-08-16 DIAGNOSIS — N1831 Chronic kidney disease, stage 3a: Secondary | ICD-10-CM | POA: Diagnosis not present

## 2024-08-16 DIAGNOSIS — M109 Gout, unspecified: Secondary | ICD-10-CM | POA: Diagnosis not present

## 2024-08-16 DIAGNOSIS — I700 Atherosclerosis of aorta: Secondary | ICD-10-CM | POA: Diagnosis not present

## 2024-08-16 DIAGNOSIS — I129 Hypertensive chronic kidney disease with stage 1 through stage 4 chronic kidney disease, or unspecified chronic kidney disease: Secondary | ICD-10-CM | POA: Diagnosis not present

## 2024-08-16 DIAGNOSIS — Z0000 Encounter for general adult medical examination without abnormal findings: Secondary | ICD-10-CM | POA: Diagnosis not present

## 2024-08-16 DIAGNOSIS — I739 Peripheral vascular disease, unspecified: Secondary | ICD-10-CM | POA: Diagnosis not present

## 2024-08-16 DIAGNOSIS — I2510 Atherosclerotic heart disease of native coronary artery without angina pectoris: Secondary | ICD-10-CM | POA: Diagnosis not present

## 2024-08-16 DIAGNOSIS — I5020 Unspecified systolic (congestive) heart failure: Secondary | ICD-10-CM | POA: Diagnosis not present

## 2024-08-16 DIAGNOSIS — E785 Hyperlipidemia, unspecified: Secondary | ICD-10-CM | POA: Diagnosis not present

## 2024-08-16 DIAGNOSIS — I255 Ischemic cardiomyopathy: Secondary | ICD-10-CM | POA: Diagnosis not present

## 2024-08-16 DIAGNOSIS — E559 Vitamin D deficiency, unspecified: Secondary | ICD-10-CM | POA: Diagnosis not present
